# Patient Record
Sex: Male | Born: 1965 | Race: White | Hispanic: No | Marital: Single | Smoking: Never smoker
Health system: Southern US, Community
[De-identification: ages and names within clinical notes are randomized; demographics above are authoritative.]

## PROBLEM LIST (undated history)

## (undated) DIAGNOSIS — F329 Major depressive disorder, single episode, unspecified: Secondary | ICD-10-CM

## (undated) DIAGNOSIS — F32A Depression, unspecified: Secondary | ICD-10-CM

## (undated) DIAGNOSIS — R480 Dyslexia and alexia: Secondary | ICD-10-CM

## (undated) DIAGNOSIS — Z8619 Personal history of other infectious and parasitic diseases: Secondary | ICD-10-CM

## (undated) HISTORY — PX: NO PAST SURGERIES: SHX2092

## (undated) HISTORY — DX: Dyslexia and alexia: R48.0

## (undated) HISTORY — DX: Depression, unspecified: F32.A

## (undated) HISTORY — DX: Personal history of other infectious and parasitic diseases: Z86.19

## (undated) HISTORY — DX: Major depressive disorder, single episode, unspecified: F32.9

---

## 2014-10-02 DIAGNOSIS — F609 Personality disorder, unspecified: Secondary | ICD-10-CM | POA: Diagnosis not present

## 2014-10-30 DIAGNOSIS — F609 Personality disorder, unspecified: Secondary | ICD-10-CM | POA: Diagnosis not present

## 2015-01-05 DIAGNOSIS — F609 Personality disorder, unspecified: Secondary | ICD-10-CM | POA: Diagnosis not present

## 2015-06-08 DIAGNOSIS — Z23 Encounter for immunization: Secondary | ICD-10-CM | POA: Diagnosis not present

## 2015-06-08 DIAGNOSIS — Z79899 Other long term (current) drug therapy: Secondary | ICD-10-CM | POA: Diagnosis not present

## 2015-06-08 DIAGNOSIS — F609 Personality disorder, unspecified: Secondary | ICD-10-CM | POA: Diagnosis not present

## 2016-06-03 DIAGNOSIS — Z23 Encounter for immunization: Secondary | ICD-10-CM | POA: Diagnosis not present

## 2016-08-03 ENCOUNTER — Ambulatory Visit (INDEPENDENT_AMBULATORY_CARE_PROVIDER_SITE_OTHER): Payer: Medicare Other | Admitting: Family Medicine

## 2016-08-03 ENCOUNTER — Encounter: Payer: Self-pay | Admitting: Family Medicine

## 2016-08-03 VITALS — BP 120/78 | HR 99 | Temp 97.9°F | Ht 74.0 in | Wt 236.4 lb

## 2016-08-03 DIAGNOSIS — Z114 Encounter for screening for human immunodeficiency virus [HIV]: Secondary | ICD-10-CM | POA: Diagnosis not present

## 2016-08-03 DIAGNOSIS — Z23 Encounter for immunization: Secondary | ICD-10-CM | POA: Diagnosis not present

## 2016-08-03 DIAGNOSIS — Z0000 Encounter for general adult medical examination without abnormal findings: Secondary | ICD-10-CM | POA: Diagnosis not present

## 2016-08-03 DIAGNOSIS — E6609 Other obesity due to excess calories: Secondary | ICD-10-CM

## 2016-08-03 DIAGNOSIS — Z13220 Encounter for screening for lipoid disorders: Secondary | ICD-10-CM

## 2016-08-03 DIAGNOSIS — Z1211 Encounter for screening for malignant neoplasm of colon: Secondary | ICD-10-CM | POA: Diagnosis not present

## 2016-08-03 DIAGNOSIS — Z6830 Body mass index (BMI) 30.0-30.9, adult: Secondary | ICD-10-CM | POA: Diagnosis not present

## 2016-08-03 LAB — COMPREHENSIVE METABOLIC PANEL
ALBUMIN: 4.5 g/dL (ref 3.5–5.2)
ALT: 26 U/L (ref 0–53)
AST: 19 U/L (ref 0–37)
Alkaline Phosphatase: 65 U/L (ref 39–117)
BUN: 11 mg/dL (ref 6–23)
CALCIUM: 9.3 mg/dL (ref 8.4–10.5)
CHLORIDE: 105 meq/L (ref 96–112)
CO2: 28 mEq/L (ref 19–32)
Creatinine, Ser: 1.09 mg/dL (ref 0.40–1.50)
GFR: 75.94 mL/min (ref >60.00)
Glucose, Bld: 120 mg/dL — ABNORMAL HIGH (ref 70–99)
POTASSIUM: 3.3 meq/L — AB (ref 3.5–5.1)
SODIUM: 142 meq/L (ref 135–145)
Total Bilirubin: 0.8 mg/dL (ref 0.2–1.2)
Total Protein: 7.5 g/dL (ref 6.0–8.3)

## 2016-08-03 LAB — LIPID PANEL
CHOL/HDL RATIO: 7
CHOLESTEROL: 300 mg/dL — AB (ref 0–200)
HDL: 45.8 mg/dL (ref >39.00)
NonHDL: 253.93
TRIGLYCERIDES: 240 mg/dL — AB (ref 0.0–149.0)
VLDL: 48 mg/dL — AB (ref 0.0–40.0)

## 2016-08-03 LAB — LDL CHOLESTEROL, DIRECT: Direct LDL: 208 mg/dL

## 2016-08-03 LAB — HIV ANTIBODY (ROUTINE TESTING W REFLEX): HIV 1&2 Ab, 4th Generation: NONREACTIVE

## 2016-08-03 MED ORDER — POTASSIUM CHLORIDE CRYS ER 20 MEQ PO TBCR: 20.0000 meq | EXTENDED_RELEASE_TABLET | Freq: Two times a day (BID) | ORAL | 1 refills | Status: DC

## 2016-08-03 NOTE — Progress Notes (Addendum)
Chief Complaint  Patient presents with  . Establish Care    Subjective: Pt here for initial Welcome to Medicare Evaluation.  Past Medical History:  Diagnosis Date  . Depression   . Dyslexia   . History of chicken pox    Family History  Problem Relation Age of Onset  . COPD Mother   . Hypertension Mother   . Diabetes Mother   . Cancer Father     Prostate  . Diabetes Father   . Hypertension Father   . Cancer Brother     Kidney  . Hypertension Brother   . Hyperlipidemia Brother   . Diabetes Brother   . Hyperlipidemia Maternal Uncle   . Hypertension Maternal Uncle   . Diabetes Maternal Uncle   . Heart disease Maternal Uncle   . Diabetes Paternal Grandmother   . Heart disease Paternal Grandmother   . Hypertension Paternal Grandmother   . Glaucoma Paternal Grandmother    No past surgical history on file.  No Known Allergies  Females: Smoking, alcohol/drugs, sunscreen  Mental Health/Substance abuse evaluation: Yes- low risk re in doing things? No  Cognitive function: AxO x 4  Fall Risk: Less than 2 falls within the past 12 months? Yes  Mindful of grabbing bars in bathroom, ruffles in rugs, poorly lit areas, handrails on the stairs? Yes   Discussion of functional ability done: Encouraged to maintain physical activity and flexibility. Live alone? No  Need help with the following? Bathing: No  Managing money: Yes  Taking medications: No  Telephone use: No  Transportation: No  Shopping: No  Preparing meals: Yes   Hearing screen: Trouble hearing TV or radio when others do not? No  Straining or struggling to hear/understand conversation? No   Fire safety: Have a working smoke alarm? Yes   Diet Balanced diet? Yes  3 meals daily? Yes  Assistance needed? No   BP 120/78 (BP Location: Left Arm, Patient Position: Sitting, Cuff Size: Large)   Pulse 99   Temp 97.9 F (36.6 C) (Oral)   Ht 6\' 2"  (1.88 m)   Wt 236 lb 6.4 oz (107.2 kg)   SpO2 98%   BMI 30.35  kg/m   End of life care planning/counseling: Does patient wish to discuss end of life care/planning? No  Does the patient have an advanced directive? No  Patient code status/living will: Full code Forms given? No   Care Team members: None  Assessment and Plan Welcome to Medicare preventive visit  Special screening for malignant neoplasms, colon - Plan: Ambulatory referral to Gastroenterology  Screening for HIV (human immunodeficiency virus) - Plan: HIV antibody  Screening cholesterol level  Class 1 obesity due to excess calories without serious comorbidity with body mass index (BMI) of 30.0 to 30.9 in adult - Plan: Comprehensive metabolic panel, Lipid panel Colonoscopy- Written plan in AVS for preventative health services. Immunizations updated today. F/u in 1 year for AWV or prn. The patient voiced understanding and agreement to the plan.  [place], DO 08/03/16 12:15 PM

## 2016-08-03 NOTE — Patient Instructions (Signed)
Will set up colonoscopy and contact you regarding result.

## 2016-08-03 NOTE — Addendum Note (Signed)
Addended by: Spindale Cellar on: 08/03/2016 04:37 PM   Modules accepted: Orders

## 2016-08-09 ENCOUNTER — Encounter: Payer: Self-pay | Admitting: Physician Assistant

## 2016-08-10 ENCOUNTER — Other Ambulatory Visit (INDEPENDENT_AMBULATORY_CARE_PROVIDER_SITE_OTHER): Payer: Medicare Other

## 2016-08-10 DIAGNOSIS — Z13220 Encounter for screening for lipoid disorders: Secondary | ICD-10-CM | POA: Diagnosis not present

## 2016-08-10 LAB — BASIC METABOLIC PANEL
BUN: 14 mg/dL (ref 6–23)
CO2: 30 mEq/L (ref 19–32)
Calcium: 9.3 mg/dL (ref 8.4–10.5)
Chloride: 104 mEq/L (ref 96–112)
Creatinine, Ser: 1.04 mg/dL (ref 0.40–1.50)
GFR: 80.16 mL/min (ref >60.00)
Glucose, Bld: 117 mg/dL — ABNORMAL HIGH (ref 70–99)
POTASSIUM: 4.5 meq/L (ref 3.5–5.1)
SODIUM: 141 meq/L (ref 135–145)

## 2016-08-12 ENCOUNTER — Telehealth: Payer: Self-pay | Admitting: Emergency Medicine

## 2016-08-12 NOTE — Telephone Encounter (Signed)
Tried to contact pt to discuss lab results. No answer, left voicemail for pt to return call.

## 2016-08-12 NOTE — Telephone Encounter (Signed)
-----   Message from Shelda Pal, DO sent at 08/10/2016 12:51 PM EST ----- K came up nicely.  TL- let pt know his potassium is now in the normal range and he can stop the supplement. Place a future BMP in 4-6 weeks and have him come in for a recheck to make sure it is still WNL off the supplement. Nonfasting. TY.

## 2016-08-17 ENCOUNTER — Ambulatory Visit: Payer: Medicare Other | Admitting: Physician Assistant

## 2016-09-19 ENCOUNTER — Other Ambulatory Visit: Payer: Self-pay | Admitting: Family Medicine

## 2016-09-19 MED ORDER — FLUOXETINE HCL 20 MG PO CAPS: 20.0000 mg | ORAL_CAPSULE | Freq: Every day | ORAL | 1 refills | Status: DC

## 2016-09-19 NOTE — Telephone Encounter (Addendum)
Relation to [id number] Call back number:[phone] Pharmacy: [facility], [place] [phone] (Phone) [phone] (Fax)      Reason for call:  Patient requesting a refill FLUoxetine (PROZAC) 20 MG capsule

## 2016-09-19 NOTE — Telephone Encounter (Signed)
Notified stepmom Rise Paganini per [facility] consent.

## 2016-10-27 ENCOUNTER — Encounter: Payer: Self-pay | Admitting: Family Medicine

## 2016-10-27 ENCOUNTER — Ambulatory Visit (INDEPENDENT_AMBULATORY_CARE_PROVIDER_SITE_OTHER): Payer: Medicare HMO | Admitting: Family Medicine

## 2016-10-27 VITALS — BP 138/87 | HR 99 | Temp 97.9°F | Ht 74.0 in | Wt 236.8 lb

## 2016-10-27 DIAGNOSIS — Z0000 Encounter for general adult medical examination without abnormal findings: Secondary | ICD-10-CM | POA: Diagnosis not present

## 2016-10-27 DIAGNOSIS — E7800 Pure hypercholesterolemia, unspecified: Secondary | ICD-10-CM | POA: Diagnosis not present

## 2016-10-27 DIAGNOSIS — E785 Hyperlipidemia, unspecified: Secondary | ICD-10-CM | POA: Insufficient documentation

## 2016-10-27 DIAGNOSIS — R454 Irritability and anger: Secondary | ICD-10-CM | POA: Diagnosis not present

## 2016-10-27 DIAGNOSIS — Z1211 Encounter for screening for malignant neoplasm of colon: Secondary | ICD-10-CM | POA: Diagnosis not present

## 2016-10-27 DIAGNOSIS — R69 Illness, unspecified: Secondary | ICD-10-CM | POA: Diagnosis not present

## 2016-10-27 NOTE — Patient Instructions (Addendum)
[facility] [address] Marily Memos [place], [place] [address] [phone]  [facility] [address] [place], [place] [address] [phone]  [facility] [place], [place] [address] [phone]  Dr. Sheralyn Boatman [address] [place], [place] [address] [phone]  Call one of these offices to set up with a psychiatrist if that is what you need. It is a self-referral process.   Healthy Eating Plan Many factors influence your heart health, including eating and exercise habits. Heart (coronary) risk increases with abnormal blood fat (lipid) levels. Heart-healthy meal planning includes limiting unhealthy fats, increasing healthy fats, and making other small dietary changes. This includes maintaining a healthy body weight to help keep lipid levels within a normal range.  WHAT IS MY PLAN?  Your health care provider recommends that you:  Drink a glass of water before meals to help with satiety.  Eat slowly.  An alternative to the water is to add Metamucil. This will help with satiety as well. It does contain calories, unlike water.  WHAT TYPES OF FAT SHOULD I CHOOSE?  Choose healthy fats more often. Choose monounsaturated and polyunsaturated fats, such as olive oil and canola oil, flaxseeds, walnuts, almonds, and seeds.  Eat more omega-3 fats. Good choices include salmon, mackerel, sardines, tuna, flaxseed oil, and ground flaxseeds. Aim to eat fish at least two times each week.  Avoid foods with partially hydrogenated oils in them. These contain trans fats. Examples of foods that contain trans fats are stick margarine, some tub margarines, cookies, crackers, and other baked goods. If you are going to avoid a fat, this is the one to avoid!  WHAT GENERAL GUIDELINES DO I NEED TO FOLLOW?  Check food labels carefully to identify foods with trans fats. Avoid these types of options when possible.  Fill one  half of your plate with vegetables and green salads. Eat 4-5 servings of vegetables per day. A serving of vegetables equals 1 cup of raw leafy vegetables,  cup of raw or cooked cut-up vegetables, or  cup of vegetable juice.  Fill one fourth of your plate with whole grains. Look for the word "whole" as the first word in the ingredient list.  Fill one fourth of your plate with lean protein foods.  Eat 4-5 servings of fruit per day. A serving of fruit equals one medium whole fruit,  cup of dried fruit,  cup of fresh, frozen, or canned fruit. Try to avoid fruits in cups/syrups as the sugar content can be high.  Eat more foods that contain soluble fiber. Examples of foods that contain this type of fiber are apples, broccoli, carrots, beans, peas, and barley. Aim to get 20-30 g of fiber per day.  Eat more home-cooked food and less restaurant, buffet, and fast food.  Limit or avoid alcohol.  Limit foods that are high in starch and sugar.  Avoid fried foods when able.  Cook foods by using methods other than frying. Baking, boiling, grilling, and broiling are all great options. Other fat-reducing suggestions include: ? Removing the skin from poultry. ? Removing all visible fats from meats. ? Skimming the fat off of stews, soups, and gravies before serving them. ? Steaming vegetables in water or broth.  Lose weight if you are overweight. Losing just 5-10% of your initial body weight can help your overall health and prevent diseases such as diabetes and heart disease.  Increase your consumption of nuts, legumes, and seeds to 4-5 servings per  week. One serving of dried beans or legumes equals  cup after being cooked, one serving of nuts equals 1 ounces, and one serving of seeds equals  ounce or 1 tablespoon.  WHAT ARE GOOD FOODS CAN I EAT? Grains Grainy breads (try to find bread that is 3 g of fiber per slice or greater), oatmeal, light popcorn. Whole-grain cereals. Rice and pasta, including  brown rice and those that are made with whole wheat. Edamame pasta is a great alternative to grain pasta. It has a higher protein content. Try to avoid significant consumption of white bread, sugary cereals, or pastries/baked goods.  Vegetables All vegetables. Cooked white potatoes do not count as vegetables.  Fruits All fruits, but limit pineapple and bananas as these fruits have a higher sugar content.  Meats and Other Protein Sources Lean, well-trimmed beef, veal, pork, and lamb. Chicken and [place] without skin. All fish and shellfish. Wild duck, rabbit, pheasant, and venison. Egg whites or low-cholesterol egg substitutes. Dried beans, peas, lentils, and tofu.Seeds and most nuts.  Dairy Low-fat or nonfat cheeses, including ricotta, string, and mozzarella. Skim or 1% milk that is liquid, powdered, or evaporated. Buttermilk that is made with low-fat milk. Nonfat or low-fat yogurt. Soy/Almond milk are good alternatives if you cannot handle dairy.  Beverages Water is the best for you. Sports drinks with less sugar are more desirable unless you are a highly active athlete.  Sweets and Desserts Sherbets and fruit ices. Honey, jam, marmalade, jelly, and syrups. Dark chocolate.  Eat all sweets and desserts in moderation.  Fats and Oils Nonhydrogenated (trans-free) margarines. Vegetable oils, including soybean, sesame, sunflower, olive, peanut, safflower, corn, canola, and cottonseed. Salad dressings or mayonnaise that are made with a vegetable oil. Limit added fats and oils that you use for cooking, baking, salads, and as spreads.  Other Cocoa powder. Coffee and tea. Most condiments.  The items listed above may not be a complete list of recommended foods or beverages. Contact your dietitian for more options.

## 2016-10-27 NOTE — Progress Notes (Signed)
Chief Complaint  Patient presents with  . Annual Exam    Well Male Ross Haynes is here for a complete physical.   His last physical was >1 year ago.  Current diet: in general, an "unhealthy" diet   Current exercise: works out at the [employer] trend: stable Does pt snore? Yes. Daytime fatigue? No. Seat belt? Yes.    Health Maintenance Flu- 2017 Td- 07/2016 Colonoscopy- wishes to see [facility] PNA- NA  Past Medical History:  Diagnosis Date  . Depression   . Dyslexia   . History of chicken pox     No past surgical history on file. Medications  Current Outpatient Prescriptions on File Prior to Visit  Medication Sig Dispense Refill  . FLUoxetine (PROZAC) 20 MG capsule Take 1 capsule (20 mg total) by mouth daily. 90 capsule 1  . OVER THE COUNTER MEDICATION Vitamin B12 supplement gummies-Chew 2 gummies by mouth daily.     Allergies No Known Allergies Family History Family History  Problem Relation Age of Onset  . COPD Mother   . Hypertension Mother   . Diabetes Mother   . Cancer Father     Prostate  . Diabetes Father   . Hypertension Father   . Cancer Brother     Kidney  . Hypertension Brother   . Hyperlipidemia Brother   . Diabetes Brother   . Hyperlipidemia Maternal Uncle   . Hypertension Maternal Uncle   . Diabetes Maternal Uncle   . Heart disease Maternal Uncle   . Diabetes Paternal Grandmother   . Heart disease Paternal Grandmother   . Hypertension Paternal Grandmother   . Glaucoma Paternal Grandmother     Review of Systems: Constitutional:  no unexpected change in weight, no fevers or chills Eye:  no recent significant change in vision Ear/Nose/Mouth/Throat:  Ears:  no tinnitus or hearing loss Nose/Mouth/Throat:  no complaints of nasal congestion or bleeding, no sore throat and oral sores Cardiovascular:  no chest pain, no palpitations Respiratory:  no cough and no shortness of breath Gastrointestinal:  no abdominal pain, no change in bowel  habits, no nausea, vomiting, diarrhea, or constipation and no black or bloody stool GU:  Male: negative for dysuria, frequency, and incontinence and negative for prostate symptoms Musculoskeletal/Extremities:  no pain, redness, or swelling of the joints Integumentary (Skin/Breast):  no abnormal skin lesions reported Neurologic:  no headaches, no numbness, tingling Endocrine:  weight changes, masses in the neck, heat/cold intolerance, bowel or skin changes, or cardiovascular system symptoms Hematologic/Lymphatic:  no abnormal bleeding, no HIV risk factors, no night sweats, no swollen nodes, no weight loss  Exam BP 138/87 (BP Location: Left Arm, Patient Position: Sitting, Cuff Size: Large)   Pulse 99   Temp 97.9 F (36.6 C) (Oral)   Ht 6\' 2"  (1.88 m)   Wt 236 lb 12.8 oz (107.4 kg)   SpO2 100% Comment: RA  BMI 30.40 kg/m  General:  well developed, well nourished, in no apparent distress Skin:  no significant moles, warts, or growths Head:  no masses, lesions, or tenderness Eyes:  pupils equal and round, sclera anicteric without injection Ears:  canals without lesions, TMs shiny without retraction, no obvious effusion, no erythema Nose:  nares patent, septum midline, mucosa normal Throat/Pharynx:  lips and gingiva without lesion; tongue and uvula midline; non-inflamed pharynx; no exudates or postnasal drainage Neck: neck supple without adenopathy, thyromegaly, or masses Lungs:  clear to auscultation, breath sounds equal bilaterally, no respiratory distress Cardio:  regular  rate and rhythm without murmurs, heart sounds without clicks or rubs Abdomen:  abdomen soft, nontender; bowel sounds normal; no masses or organomegaly Genital (male): circumcised penis, no lesions or discharge; testes present bilaterally without masses or tenderness Rectal: Deferred Musculoskeletal:  symmetrical muscle groups noted without atrophy or deformity Extremities:  no clubbing, cyanosis, or edema, no  deformities, no skin discoloration Neuro:  gait normal; deep tendon reflexes normal and symmetric Psych: well oriented with normal range of affect and appropriate judgment/insight  Assessment and Plan  Well adult exam  Difficulty controlling anger - Plan: Ambulatory referral to Psychology  Screen for colon cancer - Plan: Ambulatory referral to Gastroenterology   Well 51 y.o. male. Counseled on diet and exercise. Diet handout given.  Follow up in 2 mo to assess weight loss, dietary changes and cholesterol- if still elevated will start statin. Provided psych resources for self referral if that is what he needs. Discussed prostate cancer screen, will opt to wait until age 89 before biannual PSA testing. The patient and his guardian voiced understanding and agreement to the plan.  [place], DO 10/27/16 10:05 AM

## 2016-10-27 NOTE — Progress Notes (Signed)
Pre visit review using our clinic review tool, if applicable. No additional management support is needed unless otherwise documented below in the visit note. 

## 2017-01-04 ENCOUNTER — Encounter: Payer: Self-pay | Admitting: Family Medicine

## 2017-01-04 ENCOUNTER — Ambulatory Visit (INDEPENDENT_AMBULATORY_CARE_PROVIDER_SITE_OTHER): Payer: Medicare HMO | Admitting: Family Medicine

## 2017-01-04 ENCOUNTER — Ambulatory Visit (HOSPITAL_BASED_OUTPATIENT_CLINIC_OR_DEPARTMENT_OTHER)
Admission: RE | Admit: 2017-01-04 | Discharge: 2017-01-04 | Disposition: A | Discharge status: Home / Self Care | Payer: Medicare HMO | Source: Ambulatory Visit | Attending: Family Medicine | Admitting: Family Medicine

## 2017-01-04 VITALS — BP 133/86 | HR 84 | Temp 97.6°F | Ht 74.0 in | Wt 237.8 lb

## 2017-01-04 DIAGNOSIS — S62660S Nondisplaced fracture of distal phalanx of right index finger, sequela: Secondary | ICD-10-CM | POA: Insufficient documentation

## 2017-01-04 DIAGNOSIS — X58XXXS Exposure to other specified factors, sequela: Secondary | ICD-10-CM | POA: Insufficient documentation

## 2017-01-04 DIAGNOSIS — S6991XS Unspecified injury of right wrist, hand and finger(s), sequela: Secondary | ICD-10-CM

## 2017-01-04 DIAGNOSIS — S62660A Nondisplaced fracture of distal phalanx of right index finger, initial encounter for closed fracture: Secondary | ICD-10-CM | POA: Diagnosis not present

## 2017-01-04 NOTE — Patient Instructions (Signed)
Ice/cold pack over area for 10-15 min every 2-3 hours while awake.  OK to take Tylenol 1000 mg (2 extra strength tabs) or 975 mg (3 regular strength tabs) every 6 hours as needed.  Ibuprofen 400-600 mg (2-3 over the counter strength tabs) every 6 hours as needed for pain.  Wear the splint in public or if you are going to use your fingers/hand.

## 2017-01-04 NOTE — Progress Notes (Signed)
Pre visit review using our clinic review tool, if applicable. No additional management support is needed unless otherwise documented below in the visit note. 

## 2017-01-04 NOTE — Progress Notes (Signed)
Musculoskeletal Exam  Patient: Ross Haynes DOB: 10-Apr-1966  DOS: 01/04/2017  SUBJECTIVE:  Chief Complaint:   Chief Complaint  Patient presents with  . Finger Injury    4//22/18-(R) index-playing basketball    Ross Haynes is a 51 y.o.  male for evaluation and treatment of R index finger pain.   Onset:  15 week ago. Jammed in while playing basketball- the ball hit him square in the tip of the finger.  Location: R DIP Character:  aching  Progression of issue:  is unchanged Associated symptoms: Swelling, some redness Treatment: to date has been ice.   Neurovascular symptoms: no He has never broken a bone before.  ROS: Musculoskeletal/Extremities: +R index finger pain Neurologic: no numbness, tingling no weakness   Past Medical History:  Diagnosis Date  . Depression   . Dyslexia   . History of chicken pox    No past surgical history on file. Family History  Problem Relation Age of Onset  . COPD Mother   . Hypertension Mother   . Diabetes Mother   . Cancer Father     Prostate  . Diabetes Father   . Hypertension Father   . Cancer Brother     Kidney  . Hypertension Brother   . Hyperlipidemia Brother   . Diabetes Brother   . Hyperlipidemia Maternal Uncle   . Hypertension Maternal Uncle   . Diabetes Maternal Uncle   . Heart disease Maternal Uncle   . Diabetes Paternal Grandmother   . Heart disease Paternal Grandmother   . Hypertension Paternal Grandmother   . Glaucoma Paternal Grandmother    Current Outpatient Prescriptions  Medication Sig Dispense Refill  . FLUoxetine (PROZAC) 20 MG capsule Take 1 capsule (20 mg total) by mouth daily. 90 capsule 1  . OVER THE COUNTER MEDICATION Vitamin B12 supplement gummies-Chew 2 gummies by mouth daily.     No Known Allergies Social History   Social History  . Marital status: Single   Social History Main Topics  . Smoking status: Never Smoker  . Smokeless tobacco: Never Used  . Alcohol use No  . Drug  use: No   Objective: VITAL SIGNS: BP 133/86 (BP Location: Left Arm, Patient Position: Sitting, Cuff Size: Large)   Pulse 84   Temp 97.6 F (36.4 C) (Oral)   Ht 6\' 2"  (1.88 m)   Wt 237 lb 12.8 oz (107.9 kg)   SpO2 96%   BMI 30.53 kg/m  Constitutional: Well formed, well developed. No acute distress. Cardiovascular: Brisk cap refill Thorax & Lungs: No accessory muscle use Extremities: No clubbing. No cyanosis.  Skin: Warm. Dry.  Musculoskeletal: R index finger.   Normal active range of motion: no.   Normal passive range of motion: no Tenderness to palpation: no Edematous over DIP distal phalanx Ecchymosis: no Neurologic: Normal sensory function.  Psychiatric: Normal mood.  Alert & oriented x 3.    Assessment:  Closed nondisplaced fracture of distal phalanx of right index finger, initial encounter - Plan: Ambulatory referral to Orthopedic Surgery  Injury of finger of right hand, sequela - Plan: DG Finger Index Right  Plan: Orders as above. Frog splint given. Ibuprofen, Tylenol, ice. F/u prn. The patient voiced understanding and agreement to the plan.   [place], DO 01/04/17  9:54 AM

## 2017-01-06 DIAGNOSIS — S62660A Nondisplaced fracture of distal phalanx of right index finger, initial encounter for closed fracture: Secondary | ICD-10-CM | POA: Diagnosis not present

## 2017-01-06 DIAGNOSIS — M79644 Pain in right finger(s): Secondary | ICD-10-CM | POA: Diagnosis not present

## 2017-01-24 DIAGNOSIS — Z1211 Encounter for screening for malignant neoplasm of colon: Secondary | ICD-10-CM | POA: Diagnosis not present

## 2017-01-24 DIAGNOSIS — Z8371 Family history of colonic polyps: Secondary | ICD-10-CM | POA: Diagnosis not present

## 2017-01-24 DIAGNOSIS — D122 Benign neoplasm of ascending colon: Secondary | ICD-10-CM | POA: Diagnosis not present

## 2017-01-26 ENCOUNTER — Ambulatory Visit (INDEPENDENT_AMBULATORY_CARE_PROVIDER_SITE_OTHER): Payer: Medicare HMO | Admitting: Psychology

## 2017-01-26 DIAGNOSIS — F429 Obsessive-compulsive disorder, unspecified: Secondary | ICD-10-CM

## 2017-01-26 DIAGNOSIS — R69 Illness, unspecified: Secondary | ICD-10-CM | POA: Diagnosis not present

## 2017-01-26 DIAGNOSIS — F70 Mild intellectual disabilities: Secondary | ICD-10-CM | POA: Diagnosis not present

## 2017-02-02 DIAGNOSIS — S62660D Nondisplaced fracture of distal phalanx of right index finger, subsequent encounter for fracture with routine healing: Secondary | ICD-10-CM | POA: Diagnosis not present

## 2017-03-06 DIAGNOSIS — S62660D Nondisplaced fracture of distal phalanx of right index finger, subsequent encounter for fracture with routine healing: Secondary | ICD-10-CM | POA: Diagnosis not present

## 2017-03-14 ENCOUNTER — Telehealth: Payer: Self-pay | Admitting: Family Medicine

## 2017-03-14 NOTE — Telephone Encounter (Signed)
Caller name: Ross Haynes Relationship to patient: self Can be reached: [phone] Pharmacy: [facility], [place]  Reason for call: Pt called for refill on FLUoxetine stating he has enough til Sunday. Takes 1 per morning. Please send refills.

## 2017-03-15 ENCOUNTER — Other Ambulatory Visit: Payer: Self-pay | Admitting: Family Medicine

## 2017-03-15 MED ORDER — FLUOXETINE HCL 20 MG PO CAPS: 20.0000 mg | ORAL_CAPSULE | Freq: Every day | ORAL | 1 refills | Status: DC

## 2017-03-15 NOTE — Telephone Encounter (Signed)
Called and spoke with the pt and informed him that his refill has been approved and sent to the pharmacy.  Also informed the pt that Dr. Nani Ravens would like for him to schedule a follow-up appt..  Pt verbalized understanding and agreed.  Pt was scheduled for (Mon-07/03/17 @ 11:00am).//AB/CMA

## 2017-03-15 NOTE — Telephone Encounter (Signed)
Calling and checking on status of refill

## 2017-03-20 DIAGNOSIS — E669 Obesity, unspecified: Secondary | ICD-10-CM | POA: Diagnosis not present

## 2017-03-20 DIAGNOSIS — Z6833 Body mass index (BMI) 33.0-33.9, adult: Secondary | ICD-10-CM | POA: Diagnosis not present

## 2017-03-20 DIAGNOSIS — Z972 Presence of dental prosthetic device (complete) (partial): Secondary | ICD-10-CM | POA: Diagnosis not present

## 2017-03-20 DIAGNOSIS — Z0000 Encounter for general adult medical examination without abnormal findings: Secondary | ICD-10-CM | POA: Diagnosis not present

## 2017-03-20 DIAGNOSIS — R69 Illness, unspecified: Secondary | ICD-10-CM | POA: Diagnosis not present

## 2017-06-16 DIAGNOSIS — R69 Illness, unspecified: Secondary | ICD-10-CM | POA: Diagnosis not present

## 2017-07-03 ENCOUNTER — Encounter: Payer: Self-pay | Admitting: Family Medicine

## 2017-07-03 ENCOUNTER — Ambulatory Visit: Payer: Medicare HMO | Admitting: Family Medicine

## 2017-07-03 VITALS — BP 114/72 | HR 103 | Temp 97.9°F | Ht 74.0 in | Wt 248.2 lb

## 2017-07-03 DIAGNOSIS — Z23 Encounter for immunization: Secondary | ICD-10-CM | POA: Diagnosis not present

## 2017-07-03 DIAGNOSIS — F32A Depression, unspecified: Secondary | ICD-10-CM

## 2017-07-03 DIAGNOSIS — F329 Major depressive disorder, single episode, unspecified: Secondary | ICD-10-CM | POA: Diagnosis not present

## 2017-07-03 DIAGNOSIS — R69 Illness, unspecified: Secondary | ICD-10-CM | POA: Diagnosis not present

## 2017-07-03 DIAGNOSIS — R454 Irritability and anger: Secondary | ICD-10-CM | POA: Diagnosis not present

## 2017-07-03 NOTE — Patient Instructions (Addendum)
Stay well hydrated over the next couple days. You might not feel the greatest over the next 2 days due to the vaccine.   Please consider counseling. The medical literature and evidence-based guidelines support it. Contact [phone] to schedule an appointment or inquire about cost/insurance coverage.  Let us know if you need anything.

## 2017-07-03 NOTE — Progress Notes (Signed)
Pre visit review using our clinic review tool, if applicable. No additional management support is needed unless otherwise documented below in the visit note. 

## 2017-07-03 NOTE — Progress Notes (Signed)
Chief Complaint  Patient presents with  . Follow-up    Subjective Ross Haynes presents for f/u agitation.  He is currently being treated with Prozac.  Reports good control since treatemnt. Reports somewhat healthy diet overall. Reports getting routine exercise. No thoughts of harming self or others. No self-medication with alcohol, prescription drugs or illicit drugs. He is not following with a counselor/psychologist.  ROS Psych: No homicidal or suicidal thoughts  Past Medical History:  Diagnosis Date  . Depression   . Dyslexia   . History of chicken pox     Exam BP 114/72 (BP Location: Left Arm, Patient Position: Sitting, Cuff Size: Large)   Pulse (!) 103   Temp 97.9 F (36.6 C) (Oral)   Ht 6\' 2"  (1.88 m)   Wt 248 lb 4 oz (112.6 kg)   SpO2 95%   BMI 31.87 kg/m  General:  well developed, well nourished, in no apparent distress Lungs:  no respiratory distress Psych: well oriented with normal range of affect, alert and oriented x4.  Assessment and Plan  Difficulty controlling anger  Depression, unspecified depression type  Cont Prozac. # for counseling given. Doing well otherwise. F/u in 6 mo for CPE. The patient voiced understanding and agreement to the plan.  [place], DO 07/03/17 11:21 AM

## 2017-07-03 NOTE — Addendum Note (Signed)
Addended by: Sharon Seller B on: 07/03/2017 11:33 AM   Modules accepted: Orders

## 2017-07-07 DIAGNOSIS — H4311 Vitreous hemorrhage, right eye: Secondary | ICD-10-CM | POA: Diagnosis not present

## 2017-07-07 DIAGNOSIS — H43811 Vitreous degeneration, right eye: Secondary | ICD-10-CM | POA: Diagnosis not present

## 2017-07-07 DIAGNOSIS — H33031 Retinal detachment with giant retinal tear, right eye: Secondary | ICD-10-CM | POA: Diagnosis not present

## 2017-07-11 DIAGNOSIS — H33031 Retinal detachment with giant retinal tear, right eye: Secondary | ICD-10-CM | POA: Diagnosis not present

## 2017-07-11 DIAGNOSIS — R69 Illness, unspecified: Secondary | ICD-10-CM | POA: Diagnosis not present

## 2017-07-11 DIAGNOSIS — H4311 Vitreous hemorrhage, right eye: Secondary | ICD-10-CM | POA: Diagnosis not present

## 2017-09-11 ENCOUNTER — Telehealth: Payer: Self-pay | Admitting: Family Medicine

## 2017-09-11 ENCOUNTER — Other Ambulatory Visit: Payer: Self-pay

## 2017-09-11 MED ORDER — FLUOXETINE HCL 20 MG PO CAPS: 20.0000 mg | ORAL_CAPSULE | Freq: Every day | ORAL | 1 refills | Status: DC

## 2017-09-11 NOTE — Telephone Encounter (Signed)
Copied from [place] [id number]. Topic: Quick Communication - Rx Refill/Question >> Sep 11, 2017 11:57 AM Marin Olp L wrote: Medication: FLUoxetine (PROZAC) 20 MG capsule    Has the patient contacted their pharmacy? No. (no refills)   (Agent: If no, request that the patient contact the pharmacy for the refill.)   Preferred Pharmacy (with phone number or street name): [facility], [place]: Please be advised that RX refills may take up to 3 business days. We ask that you follow-up with your pharmacy.

## 2017-11-10 ENCOUNTER — Telehealth: Payer: Self-pay | Admitting: Family Medicine

## 2017-11-10 NOTE — Telephone Encounter (Signed)
Attempted to call patient to schedule Annual Wellness Visit, but patient did not answer. Will try to call patient again at a later time. SF °

## 2017-11-23 ENCOUNTER — Encounter: Payer: Self-pay | Admitting: Family Medicine

## 2017-11-23 ENCOUNTER — Ambulatory Visit (INDEPENDENT_AMBULATORY_CARE_PROVIDER_SITE_OTHER): Payer: Medicare HMO | Admitting: Family Medicine

## 2017-11-23 VITALS — BP 120/80 | HR 118 | Temp 98.0°F | Ht 74.0 in | Wt 249.5 lb

## 2017-11-23 DIAGNOSIS — L237 Allergic contact dermatitis due to plants, except food: Secondary | ICD-10-CM | POA: Diagnosis not present

## 2017-11-23 MED ORDER — PREDNISONE 20 MG PO TABS: ORAL_TABLET | ORAL | 0 refills | Status: DC

## 2017-11-23 NOTE — Progress Notes (Signed)
Pre visit review using our clinic review tool, if applicable. No additional management support is needed unless otherwise documented below in the visit note. 

## 2017-11-23 NOTE — Patient Instructions (Addendum)
Healthy Eating Plan Many factors influence your heart health, including eating and exercise habits. Heart (coronary) risk increases with abnormal blood fat (lipid) levels. Heart-healthy meal planning includes limiting unhealthy fats, increasing healthy fats, and making other small dietary changes. This includes maintaining a healthy body weight to help keep lipid levels within a normal range.  WHAT IS MY PLAN?  Your health care provider recommends that you:  Drink a glass of water before meals to help with satiety.  Eat slowly.  An alternative to the water is to add Metamucil. This will help with satiety as well. It does contain calories, unlike water.  WHAT TYPES OF FAT SHOULD I CHOOSE?  Choose healthy fats more often. Choose monounsaturated and polyunsaturated fats, such as olive oil and canola oil, flaxseeds, walnuts, almonds, and seeds.  Eat more omega-3 fats. Good choices include salmon, mackerel, sardines, tuna, flaxseed oil, and ground flaxseeds. Aim to eat fish at least two times each week.  Avoid foods with partially hydrogenated oils in them. These contain trans fats. Examples of foods that contain trans fats are stick margarine, some tub margarines, cookies, crackers, and other baked goods. If you are going to avoid a fat, this is the one to avoid!  WHAT GENERAL GUIDELINES DO I NEED TO FOLLOW?  Check food labels carefully to identify foods with trans fats. Avoid these types of options when possible.  Fill one half of your plate with vegetables and green salads. Eat 4-5 servings of vegetables per day. A serving of vegetables equals 1 cup of raw leafy vegetables,  cup of raw or cooked cut-up vegetables, or  cup of vegetable juice.  Fill one fourth of your plate with whole grains. Look for the word "whole" as the first word in the ingredient list.  Fill one fourth of your plate with lean protein foods.  Eat 4-5 servings of fruit per day. A serving of fruit equals one medium  whole fruit,  cup of dried fruit,  cup of fresh, frozen, or canned fruit. Try to avoid fruits in cups/syrups as the sugar content can be high.  Eat more foods that contain soluble fiber. Examples of foods that contain this type of fiber are apples, broccoli, carrots, beans, peas, and barley. Aim to get 20-30 g of fiber per day.  Eat more home-cooked food and less restaurant, buffet, and fast food.  Limit or avoid alcohol.  Limit foods that are high in starch and sugar.  Avoid fried foods when able.  Cook foods by using methods other than frying. Baking, boiling, grilling, and broiling are all great options. Other fat-reducing suggestions include: ? Removing the skin from poultry. ? Removing all visible fats from meats. ? Skimming the fat off of stews, soups, and gravies before serving them. ? Steaming vegetables in water or broth.  Lose weight if you are overweight. Losing just 5-10% of your initial body weight can help your overall health and prevent diseases such as diabetes and heart disease.  Increase your consumption of nuts, legumes, and seeds to 4-5 servings per week. One serving of dried beans or legumes equals  cup after being cooked, one serving of nuts equals 1 ounces, and one serving of seeds equals  ounce or 1 tablespoon.  WHAT ARE GOOD FOODS CAN I EAT? Grains Grainy breads (try to find bread that is 3 g of fiber per slice or greater), oatmeal, light popcorn. Whole-grain cereals. Rice and pasta, including brown rice and those that are made with whole wheat.  Edamame pasta is a great alternative to grain pasta. It has a higher protein content. Try to avoid significant consumption of white bread, sugary cereals, or pastries/baked goods.  Vegetables All vegetables. Cooked white potatoes do not count as vegetables.  Fruits All fruits, but limit pineapple and bananas as these fruits have a higher sugar content.  Meats and Other Protein Sources Lean, well-trimmed beef,  veal, pork, and lamb. Chicken and [place] without skin. All fish and shellfish. Wild duck, rabbit, pheasant, and venison. Egg whites or low-cholesterol egg substitutes. Dried beans, peas, lentils, and tofu.Seeds and most nuts.  Dairy Low-fat or nonfat cheeses, including ricotta, string, and mozzarella. Skim or 1% milk that is liquid, powdered, or evaporated. Buttermilk that is made with low-fat milk. Nonfat or low-fat yogurt. Soy/Almond milk are good alternatives if you cannot handle dairy.  Beverages Water is the best for you. Sports drinks with less sugar are more desirable unless you are a highly active athlete.  Sweets and Desserts Sherbets and fruit ices. Honey, jam, marmalade, jelly, and syrups. Dark chocolate.  Eat all sweets and desserts in moderation.  Fats and Oils Nonhydrogenated (trans-free) margarines. Vegetable oils, including soybean, sesame, sunflower, olive, peanut, safflower, corn, canola, and cottonseed. Salad dressings or mayonnaise that are made with a vegetable oil. Limit added fats and oils that you use for cooking, baking, salads, and as spreads.  Other Cocoa powder. Coffee and tea. Most condiments.  The items listed above may not be a complete list of recommended foods or beverages. Contact your dietitian for more options.  Aim to do some physical exertion for 150 minutes per week. This is typically divided into 5 days per week, 30 minutes per day. The activity should be enough to get your heart rate up. Anything is better than nothing if you have time constraints.  Let us know if you need anything.

## 2017-11-23 NOTE — Progress Notes (Signed)
Chief Complaint  Patient presents with  . Poison Roca is a 52 y.o. male here for a skin complaint.  Duration: 4 days Location: arms, hands, around eyes, behind knees Pruritic? Yes Painful? No Drainage? No New soaps/lotions/topicals/detergents? No Sick contacts? No  Has poison oak in back yard, was exposed while doing yardwork.  Other associated symptoms: some scaling Therapies tried thus far: Calamine, otc poison ivy med, rubbing alcohol.   ROS:  Const: No fevers Skin: As noted in HPI  Past Medical History:  Diagnosis Date  . Depression   . Dyslexia   . History of chicken pox    No Known Allergies Allergies as of 11/23/2017   No Known Allergies     Medication List        Accurate as of 11/23/17  7:28 AM. Always use your most recent med list.          FLUoxetine 20 MG capsule Commonly known as:  PROZAC Take 1 capsule (20 mg total) by mouth daily.   OVER THE COUNTER MEDICATION Vitamin B12 supplement gummies-Chew 2 gummies by mouth daily.   predniSONE 20 MG tablet Commonly known as:  DELTASONE 3 tabs daily for 1 week then 2 tabs daily for 1 week and then 1 tab daily.       BP 120/80 (BP Location: Left Arm, Patient Position: Sitting, Cuff Size: Large)   Pulse (!) 118   Temp 98 F (36.7 C) (Oral)   Ht 6\' 2"  (1.88 m)   Wt 249 lb 8 oz (113.2 kg)   SpO2 94%   BMI 32.03 kg/m  Gen: awake, alert, appearing stated age Lungs: No accessory muscle use Skin: See below. No drainage, TTP, fluctuance, excessive warmth Psych: Age appropriate judgment and insight     RUE    L hand    LUE  Poison oak - Plan: predniSONE (DELTASONE) 20 MG tablet  Orders as above. 3 weeks taper, 60 mg pred for 1 week, 40 mg for 1 week then 20 mg for 1 week. Try not to scratch. Also gave healthy diet handout to help lose weight. F/u prn. The patient voiced understanding and agreement to the plan.  [place], DO 11/23/17 7:28 AM

## 2017-11-30 DIAGNOSIS — H35371 Puckering of macula, right eye: Secondary | ICD-10-CM | POA: Diagnosis not present

## 2017-11-30 DIAGNOSIS — H59811 Chorioretinal scars after surgery for detachment, right eye: Secondary | ICD-10-CM | POA: Diagnosis not present

## 2017-12-21 ENCOUNTER — Ambulatory Visit (INDEPENDENT_AMBULATORY_CARE_PROVIDER_SITE_OTHER): Payer: Medicare HMO | Admitting: Family Medicine

## 2017-12-21 ENCOUNTER — Encounter: Payer: Self-pay | Admitting: Family Medicine

## 2017-12-21 VITALS — BP 118/80 | HR 112 | Temp 98.6°F | Ht 74.0 in | Wt 249.0 lb

## 2017-12-21 DIAGNOSIS — J301 Allergic rhinitis due to pollen: Secondary | ICD-10-CM

## 2017-12-21 MED ORDER — LEVOCETIRIZINE DIHYDROCHLORIDE 5 MG PO TABS: 5.0000 mg | ORAL_TABLET | Freq: Every evening | ORAL | 1 refills | Status: DC

## 2017-12-21 NOTE — Progress Notes (Signed)
Pre visit review using our clinic review tool, if applicable. No additional management support is needed unless otherwise documented below in the visit note. 

## 2017-12-21 NOTE — Patient Instructions (Addendum)
Claritin (loratadine), Allegra (fexofenadine), Zyrtec (cetirizine); these are listed in order from weakest to strongest. Generic, and therefore cheaper, options are in the parentheses.   Flonase (fluticasone); nasal spray that is over the counter. 2 sprays each nostril, once daily. Aim towards the same side eye when you spray.  There are available OTC, and the generic versions, which may be cheaper, are in parentheses. Show this to a pharmacist if you have trouble finding any of these items.  If you start having fevers, shaking or shortness of breath, seek immediate care.

## 2017-12-21 NOTE — Progress Notes (Signed)
Chief Complaint  Patient presents with  . Nasal Congestion  . Cough    Calla Kicks here for URI complaints.  Duration: 4 days  Associated symptoms: sinus congestion, rhinorrhea, itchy watery eyes and cough Denies: sinus pain, ear pain, ear drainage, sore throat, wheezing, shortness of breath, myalgia and fevers Treatment to date: Allergy medicine  Sick contacts: No  ROS:  Const: Denies fevers HEENT: As noted in HPI Lungs: No SOB  Past Medical History:  Diagnosis Date  . Depression   . Dyslexia   . History of chicken pox      BP 118/80 (BP Location: Left Arm, Patient Position: Sitting, Cuff Size: Large)   Pulse (!) 112   Temp 98.6 F (37 C) (Oral)   Ht 6\' 2"  (1.88 m)   Wt 249 lb (112.9 kg)   SpO2 94%   BMI 31.97 kg/m  General: Awake, alert, appears stated age HEENT: AT, , ears patent b/l and TM's neg, nares patent w/o discharge, pharynx pink and without exudates, MMM Neck: No masses or asymmetry Heart: RRR Lungs: CTAB, no accessory muscle use Psych: Age appropriate judgment and insight, normal mood and affect  Seasonal allergic rhinitis due to pollen - Plan: levocetirizine (XYZAL) 5 MG tablet  Orders as above. Reports some improvement, likely not bacterial.  F/u prn. If starting to experience fevers, shaking, or shortness of breath, seek immediate care. Pt voiced understanding and agreement to the plan.  [place], DO 12/21/17 11:52 AM

## 2018-01-04 ENCOUNTER — Encounter: Payer: Self-pay | Admitting: Family Medicine

## 2018-01-04 ENCOUNTER — Ambulatory Visit (INDEPENDENT_AMBULATORY_CARE_PROVIDER_SITE_OTHER): Payer: Medicare HMO | Admitting: Family Medicine

## 2018-01-04 ENCOUNTER — Other Ambulatory Visit: Payer: Self-pay | Admitting: Family Medicine

## 2018-01-04 ENCOUNTER — Telehealth: Payer: Self-pay | Admitting: Family Medicine

## 2018-01-04 VITALS — BP 130/84 | HR 92 | Temp 97.7°F | Resp 16 | Ht 71.5 in | Wt 250.6 lb

## 2018-01-04 DIAGNOSIS — R739 Hyperglycemia, unspecified: Secondary | ICD-10-CM

## 2018-01-04 DIAGNOSIS — E782 Mixed hyperlipidemia: Secondary | ICD-10-CM

## 2018-01-04 DIAGNOSIS — Z23 Encounter for immunization: Secondary | ICD-10-CM | POA: Diagnosis not present

## 2018-01-04 DIAGNOSIS — Z0000 Encounter for general adult medical examination without abnormal findings: Secondary | ICD-10-CM | POA: Diagnosis not present

## 2018-01-04 LAB — LIPID PANEL
CHOL/HDL RATIO: 7
Cholesterol: 287 mg/dL — ABNORMAL HIGH (ref 0–200)
HDL: 40.3 mg/dL (ref >39.00)
NONHDL: 246.97
TRIGLYCERIDES: 257 mg/dL — AB (ref 0.0–149.0)
VLDL: 51.4 mg/dL — ABNORMAL HIGH (ref 0.0–40.0)

## 2018-01-04 LAB — COMPREHENSIVE METABOLIC PANEL
ALT: 35 U/L (ref 0–53)
AST: 22 U/L (ref 0–37)
Albumin: 4.1 g/dL (ref 3.5–5.2)
Alkaline Phosphatase: 56 U/L (ref 39–117)
BUN: 12 mg/dL (ref 6–23)
CALCIUM: 9.6 mg/dL (ref 8.4–10.5)
CHLORIDE: 106 meq/L (ref 96–112)
CO2: 30 meq/L (ref 19–32)
CREATININE: 1.02 mg/dL (ref 0.40–1.50)
GFR: 81.52 mL/min (ref >60.00)
GLUCOSE: 133 mg/dL — AB (ref 70–99)
Potassium: 4.4 mEq/L (ref 3.5–5.1)
Sodium: 143 mEq/L (ref 135–145)
Total Bilirubin: 0.5 mg/dL (ref 0.2–1.2)
Total Protein: 6.9 g/dL (ref 6.0–8.3)

## 2018-01-04 LAB — LDL CHOLESTEROL, DIRECT: LDL DIRECT: 206 mg/dL

## 2018-01-04 MED ORDER — ATORVASTATIN CALCIUM 40 MG PO TABS: 40.0000 mg | ORAL_TABLET | Freq: Every day | ORAL | 3 refills | Status: DC

## 2018-01-04 NOTE — Telephone Encounter (Signed)
See results notes. 

## 2018-01-04 NOTE — Progress Notes (Signed)
Chief Complaint  Patient presents with  . Annual Exam    Here for annual physical exam   Well Male Rosser Collington is here for a complete physical.   His last physical was >1 year ago.  Current diet: in general, a "healthy" diet.  Current exercise: basketball 2x/week, walk Weight trend: stable Does pt snore? No apneic episodes, does snore Daytime fatigue? No. Seat belt? Yes.    Health maintenance Shingrix- Yes Colonoscopy- Yes - 01/04/18 Tetanus- Yes HIV- Yes Hep C- Yes Prostate cancer screening- No   Past Medical History:  Diagnosis Date  . Depression   . Dyslexia   . History of chicken pox       Past Surgical History:  Procedure Laterality Date  . NO PAST SURGERIES      Medications  Current Outpatient Medications on File Prior to Visit  Medication Sig Dispense Refill  . FLUoxetine (PROZAC) 20 MG capsule Take 1 capsule (20 mg total) by mouth daily. 90 capsule 1  . levocetirizine (XYZAL) 5 MG tablet Take 1 tablet (5 mg total) by mouth every evening. 30 tablet 1   Allergies No Known Allergies  Family History Family History  Problem Relation Age of Onset  . COPD Mother   . Hypertension Mother   . Diabetes Mother   . Cancer Father        Prostate  . Diabetes Father   . Hypertension Father   . Cancer Brother        Kidney  . Hypertension Brother   . Hyperlipidemia Brother   . Diabetes Brother   . Hyperlipidemia Maternal Uncle   . Hypertension Maternal Uncle   . Diabetes Maternal Uncle   . Heart disease Maternal Uncle   . Diabetes Paternal Grandmother   . Heart disease Paternal Grandmother   . Hypertension Paternal Grandmother   . Glaucoma Paternal Grandmother     Review of Systems: Constitutional:  no fevers Eye:  no recent significant change in vision Ear/Nose/Mouth/Throat:  Ears:  no hearing loss Nose/Mouth/Throat:  no complaints of nasal congestion, no sore throat Cardiovascular:  no chest pain, no palpitations Respiratory:  no cough and no  shortness of breath Gastrointestinal:  no abdominal pain, no change in bowel habits GU:  Male: negative for dysuria, frequency, and incontinence and negative for prostate symptoms Musculoskeletal/Extremities:  no pain, redness, or swelling of the joints Integumentary (Skin/Breast):  no abnormal skin lesions reported Neurologic:  no headaches Endocrine: No unexpected weight changes Hematologic/Lymphatic:  no abnormal bleeding  Exam BP 130/84 (BP Location: Left Arm, Patient Position: Sitting, Cuff Size: Large)   Pulse 92   Temp 97.7 F (36.5 C) (Oral)   Resp 16   Ht 5' 11.5" (1.816 m)   Wt 250 lb 9.6 oz (113.7 kg)   SpO2 98%   BMI 34.46 kg/m  General:  well developed, well nourished, in no apparent distress Skin:  no significant moles, warts, or growths Head:  no masses, lesions, or tenderness Eyes:  pupils equal and round, sclera anicteric without injection Ears:  canals without lesions, TMs shiny without retraction, no obvious effusion, no erythema Nose:  nares patent, septum midline, mucosa normal Throat/Pharynx:  lips and gingiva without lesion; tongue and uvula midline; non-inflamed pharynx; no exudates or postnasal drainage Neck: neck supple without adenopathy, thyromegaly, or masses Lungs:  clear to auscultation, breath sounds equal bilaterally, no respiratory distress Cardio:  regular rate and rhythm, no LE edema, no bruits Abdomen:  abdomen soft, nontender; bowel sounds  normal; no masses or organomegaly Genital (male): circumcised penis, no lesions or discharge; testes present bilaterally without masses or tenderness Rectal: Deferred Musculoskeletal:  symmetrical muscle groups noted without atrophy or deformity Extremities:  no clubbing, cyanosis, or edema, no deformities, no skin discoloration Neuro:  gait normal; deep tendon reflexes normal and symmetric Psych: well oriented with normal range of affect and appropriate judgment/insight  Assessment and Plan  Well adult  exam - Plan: Comprehensive metabolic panel, Lipid panel  Need for shingles vaccine - Plan: Varicella-zoster vaccine IM (Shingrix)   Well 52 y.o. male. Counseled on diet and exercise. Counseled on risks and benefits of prostate cancer screening with PSA. The patient agrees to forego testing. Immunizations, labs, and further orders as above. Follow up 6 mo. The patient voiced understanding and agreement to the plan.  [place], DO 01/04/18 8:27 AM

## 2018-01-04 NOTE — Telephone Encounter (Signed)
Copied from [place] [id number]. Topic: Quick Communication - See Telephone Encounter >> Jan 04, 2018  3:59 PM Robina Ade, Helene Kelp D wrote: CRM for notification. See Telephone encounter for: 01/04/18. Patient called and was returning office missed call he had about his lab results. There was no CRM for our triage nurse to give results. Please call patient back, thanks.

## 2018-01-04 NOTE — Patient Instructions (Addendum)
Stay active. Keep the diet clean.  1-2 days to get the results of your labs back.   Let us know if you need anything.

## 2018-01-08 ENCOUNTER — Other Ambulatory Visit (INDEPENDENT_AMBULATORY_CARE_PROVIDER_SITE_OTHER): Payer: Medicare HMO

## 2018-01-08 DIAGNOSIS — R739 Hyperglycemia, unspecified: Secondary | ICD-10-CM

## 2018-01-08 LAB — HEMOGLOBIN A1C: HEMOGLOBIN A1C: 6.4 % (ref 4.6–6.5)

## 2018-01-31 DIAGNOSIS — H59811 Chorioretinal scars after surgery for detachment, right eye: Secondary | ICD-10-CM | POA: Diagnosis not present

## 2018-01-31 DIAGNOSIS — H43811 Vitreous degeneration, right eye: Secondary | ICD-10-CM | POA: Diagnosis not present

## 2018-02-02 DIAGNOSIS — E7800 Pure hypercholesterolemia, unspecified: Secondary | ICD-10-CM | POA: Diagnosis not present

## 2018-02-02 DIAGNOSIS — R69 Illness, unspecified: Secondary | ICD-10-CM | POA: Diagnosis not present

## 2018-02-02 DIAGNOSIS — Z638 Other specified problems related to primary support group: Secondary | ICD-10-CM | POA: Diagnosis not present

## 2018-02-02 DIAGNOSIS — R030 Elevated blood-pressure reading, without diagnosis of hypertension: Secondary | ICD-10-CM | POA: Diagnosis not present

## 2018-02-02 DIAGNOSIS — Z79899 Other long term (current) drug therapy: Secondary | ICD-10-CM | POA: Diagnosis not present

## 2018-02-12 MED ORDER — FLUOXETINE HCL 20 MG PO CAPS
20.00 | ORAL_CAPSULE | ORAL | Status: DC
Start: 2018-02-11 — End: 2018-02-12

## 2018-02-14 ENCOUNTER — Other Ambulatory Visit: Payer: Self-pay | Admitting: Family Medicine

## 2018-02-14 DIAGNOSIS — J301 Allergic rhinitis due to pollen: Secondary | ICD-10-CM

## 2018-02-14 MED ORDER — LEVOCETIRIZINE DIHYDROCHLORIDE 5 MG PO TABS: 5.0000 mg | ORAL_TABLET | Freq: Every evening | ORAL | 0 refills | Status: DC

## 2018-02-14 MED ORDER — FLUOXETINE HCL 20 MG PO CAPS: 20.0000 mg | ORAL_CAPSULE | Freq: Every day | ORAL | 0 refills | Status: DC

## 2018-02-14 MED ORDER — ATORVASTATIN CALCIUM 40 MG PO TABS: 40.0000 mg | ORAL_TABLET | Freq: Every day | ORAL | 0 refills | Status: DC

## 2018-02-20 ENCOUNTER — Telehealth: Payer: Self-pay | Admitting: *Deleted

## 2018-02-20 NOTE — Telephone Encounter (Signed)
Received Physician Orders from [facility]; forwarded to provider/SLS 06/25

## 2018-02-27 ENCOUNTER — Telehealth: Payer: Self-pay | Admitting: *Deleted

## 2018-02-27 NOTE — Telephone Encounter (Addendum)
Received FL2, personal care Physician Authorization and Care Plan, standing Orders for Medication & Treatment; forwarded to provider/SLS 07/03

## 2018-03-09 ENCOUNTER — Telehealth: Payer: Self-pay | Admitting: Family Medicine

## 2018-03-09 MED ORDER — FLUOXETINE HCL 20 MG PO CAPS: 20.0000 mg | ORAL_CAPSULE | Freq: Every day | ORAL | 3 refills | Status: DC

## 2018-03-09 MED ORDER — ATORVASTATIN CALCIUM 40 MG PO TABS: 40.0000 mg | ORAL_TABLET | Freq: Every day | ORAL | 3 refills | Status: DC

## 2018-03-09 NOTE — Telephone Encounter (Signed)
Copied from [place] [id number]. Topic: Quick Communication - See Telephone Encounter >> Mar 09, 2018 10:04 AM Conception Chancy, NT wrote: CRM for notification. See Telephone encounter for: 03/09/18.  Patient is needing a refill on atorvastatin (LIPITOR) 40 MG tablet  and FLUoxetine (PROZAC) 20 MG capsule.   [facility] [phone] - [place], [place] - [address] [place] [address] Phone: [phone] Fax: [phone]

## 2018-03-15 DIAGNOSIS — H25013 Cortical age-related cataract, bilateral: Secondary | ICD-10-CM | POA: Diagnosis not present

## 2018-03-15 DIAGNOSIS — H35371 Puckering of macula, right eye: Secondary | ICD-10-CM | POA: Diagnosis not present

## 2018-03-15 DIAGNOSIS — H2513 Age-related nuclear cataract, bilateral: Secondary | ICD-10-CM | POA: Diagnosis not present

## 2018-03-15 DIAGNOSIS — H2511 Age-related nuclear cataract, right eye: Secondary | ICD-10-CM | POA: Diagnosis not present

## 2018-03-15 DIAGNOSIS — H33001 Unspecified retinal detachment with retinal break, right eye: Secondary | ICD-10-CM | POA: Diagnosis not present

## 2018-03-15 DIAGNOSIS — H25043 Posterior subcapsular polar age-related cataract, bilateral: Secondary | ICD-10-CM | POA: Diagnosis not present

## 2018-03-15 DIAGNOSIS — H18413 Arcus senilis, bilateral: Secondary | ICD-10-CM | POA: Diagnosis not present

## 2018-03-20 DIAGNOSIS — J309 Allergic rhinitis, unspecified: Secondary | ICD-10-CM | POA: Diagnosis not present

## 2018-03-20 DIAGNOSIS — E669 Obesity, unspecified: Secondary | ICD-10-CM | POA: Diagnosis not present

## 2018-03-20 DIAGNOSIS — F419 Anxiety disorder, unspecified: Secondary | ICD-10-CM | POA: Diagnosis not present

## 2018-03-20 DIAGNOSIS — G8929 Other chronic pain: Secondary | ICD-10-CM | POA: Diagnosis not present

## 2018-03-20 DIAGNOSIS — R509 Fever, unspecified: Secondary | ICD-10-CM | POA: Diagnosis not present

## 2018-03-20 DIAGNOSIS — E785 Hyperlipidemia, unspecified: Secondary | ICD-10-CM | POA: Diagnosis not present

## 2018-03-20 DIAGNOSIS — Z6834 Body mass index (BMI) 34.0-34.9, adult: Secondary | ICD-10-CM | POA: Diagnosis not present

## 2018-03-20 DIAGNOSIS — R69 Illness, unspecified: Secondary | ICD-10-CM | POA: Diagnosis not present

## 2018-03-20 DIAGNOSIS — K5900 Constipation, unspecified: Secondary | ICD-10-CM | POA: Diagnosis not present

## 2018-03-20 DIAGNOSIS — K219 Gastro-esophageal reflux disease without esophagitis: Secondary | ICD-10-CM | POA: Diagnosis not present

## 2018-04-02 ENCOUNTER — Other Ambulatory Visit (INDEPENDENT_AMBULATORY_CARE_PROVIDER_SITE_OTHER): Payer: Medicare HMO

## 2018-04-02 DIAGNOSIS — E782 Mixed hyperlipidemia: Secondary | ICD-10-CM | POA: Diagnosis not present

## 2018-04-02 DIAGNOSIS — R739 Hyperglycemia, unspecified: Secondary | ICD-10-CM | POA: Diagnosis not present

## 2018-04-02 LAB — HEPATIC FUNCTION PANEL
ALT: 57 U/L — AB (ref 0–53)
AST: 25 U/L (ref 0–37)
Albumin: 4.4 g/dL (ref 3.5–5.2)
Alkaline Phosphatase: 68 U/L (ref 39–117)
BILIRUBIN TOTAL: 1.1 mg/dL (ref 0.2–1.2)
Bilirubin, Direct: 0.2 mg/dL (ref 0.0–0.3)
TOTAL PROTEIN: 6.8 g/dL (ref 6.0–8.3)

## 2018-04-02 LAB — LIPID PANEL
CHOLESTEROL: 171 mg/dL (ref 0–200)
HDL: 38.9 mg/dL — ABNORMAL LOW (ref >39.00)
LDL CALC: 98 mg/dL (ref 0–99)
NonHDL: 132.59
TRIGLYCERIDES: 175 mg/dL — AB (ref 0.0–149.0)
Total CHOL/HDL Ratio: 4
VLDL: 35 mg/dL (ref 0.0–40.0)

## 2018-04-04 ENCOUNTER — Telehealth: Payer: Self-pay | Admitting: Family Medicine

## 2018-04-04 ENCOUNTER — Other Ambulatory Visit: Payer: Self-pay | Admitting: Family Medicine

## 2018-04-04 DIAGNOSIS — R740 Nonspecific elevation of levels of transaminase and lactic acid dehydrogenase [LDH]: Principal | ICD-10-CM

## 2018-04-04 DIAGNOSIS — E785 Hyperlipidemia, unspecified: Secondary | ICD-10-CM | POA: Diagnosis not present

## 2018-04-04 DIAGNOSIS — E669 Obesity, unspecified: Secondary | ICD-10-CM | POA: Diagnosis not present

## 2018-04-04 DIAGNOSIS — H2511 Age-related nuclear cataract, right eye: Secondary | ICD-10-CM | POA: Diagnosis not present

## 2018-04-04 DIAGNOSIS — Z79899 Other long term (current) drug therapy: Secondary | ICD-10-CM | POA: Diagnosis not present

## 2018-04-04 DIAGNOSIS — R69 Illness, unspecified: Secondary | ICD-10-CM | POA: Diagnosis not present

## 2018-04-04 DIAGNOSIS — Z6833 Body mass index (BMI) 33.0-33.9, adult: Secondary | ICD-10-CM | POA: Diagnosis not present

## 2018-04-04 DIAGNOSIS — R7401 Elevation of levels of liver transaminase levels: Secondary | ICD-10-CM

## 2018-04-04 NOTE — Telephone Encounter (Signed)
Copied from [place] [id number] [id number]. Topic: Quick Communication - Lab Results >> Apr 03, 2018  1:05 PM Synthia Innocent wrote: Returning call  >> Apr 03, 2018  1:12 PM Synthia Innocent wrote: Prior note error, requesting lab results error

## 2018-04-06 ENCOUNTER — Other Ambulatory Visit: Payer: Self-pay | Admitting: Family Medicine

## 2018-04-06 DIAGNOSIS — J301 Allergic rhinitis due to pollen: Secondary | ICD-10-CM

## 2018-04-09 ENCOUNTER — Other Ambulatory Visit: Payer: Self-pay

## 2018-04-09 ENCOUNTER — Other Ambulatory Visit: Payer: Self-pay | Admitting: Family Medicine

## 2018-04-09 DIAGNOSIS — J301 Allergic rhinitis due to pollen: Secondary | ICD-10-CM

## 2018-04-11 DIAGNOSIS — H2513 Age-related nuclear cataract, bilateral: Secondary | ICD-10-CM | POA: Diagnosis not present

## 2018-04-12 ENCOUNTER — Encounter: Payer: Self-pay | Admitting: Family Medicine

## 2018-04-12 ENCOUNTER — Ambulatory Visit (INDEPENDENT_AMBULATORY_CARE_PROVIDER_SITE_OTHER): Payer: Medicare HMO | Admitting: Family Medicine

## 2018-04-12 VITALS — BP 124/78 | HR 103 | Temp 97.6°F | Ht 73.0 in | Wt 254.1 lb

## 2018-04-12 DIAGNOSIS — R740 Nonspecific elevation of levels of transaminase and lactic acid dehydrogenase [LDH]: Secondary | ICD-10-CM

## 2018-04-12 DIAGNOSIS — R7303 Prediabetes: Secondary | ICD-10-CM

## 2018-04-12 DIAGNOSIS — R7401 Elevation of levels of liver transaminase levels: Secondary | ICD-10-CM

## 2018-04-12 DIAGNOSIS — E7800 Pure hypercholesterolemia, unspecified: Secondary | ICD-10-CM | POA: Diagnosis not present

## 2018-04-12 DIAGNOSIS — J301 Allergic rhinitis due to pollen: Secondary | ICD-10-CM | POA: Diagnosis not present

## 2018-04-12 MED ORDER — LEVOCETIRIZINE DIHYDROCHLORIDE 5 MG PO TABS: ORAL_TABLET | ORAL | 3 refills | Status: DC

## 2018-04-12 NOTE — Progress Notes (Addendum)
Chief Complaint  Patient presents with  . Follow-up    Subjective: Patient is a 52 y.o. male here for lab f/u.  Patient was diagnosed with prediabetes with an A1c of 6.4 several months ago.  He reports his diet is healthy overall.  He has not been as active since moving into assisted living.  He had an explosive argument with his father and was kicked out of the house.  He is compliant with his psychiatric medicines and not having any side effects.  He was supposed to be seeing psychiatry but is only seeing counseling right now.  No homicidal or suicidal ideation.   ROS: Psych: No HI   Past Medical History:  Diagnosis Date  . Depression   . Dyslexia   . History of chicken pox     Objective: BP 124/78 (BP Location: Left Arm, Patient Position: Sitting, Cuff Size: Large)   Pulse (!) 103   Temp 97.6 F (36.4 C) (Oral)   Ht 6\' 1"  (1.854 m)   Wt 254 lb 2 oz (115.3 kg)   SpO2 93%   BMI 33.53 kg/m  General: Awake, appears stated age Heart: RRR, no LE edema, no bruits Lungs: CTAB, no rales, wheezes or rhonchi. No accessory muscle use Psych: normal affect and mood  Assessment and Plan: Pure hypercholesterolemia  Prediabetes  Seasonal allergic rhinitis due to pollen - Plan: levocetirizine (XYZAL) 5 MG tablet  Elevated ALT measurement - Plan: Hepatic function panel  Doing well on Lipitor.  Counseled on diet and exercise.  We will recheck A1c at next visit as it has not been 90 days yet. Recheck hepatic function panel for elevated ALT.  If still elevated, will obtain right upper quadrant ultrasound. Given his recent outbreak, I would like him to see psychiatry.  He currently resides in [place].  I stressed that his assisted living facility has psychiatric resources in that area.  I did provide him with some psychiatric resources in this area.  It is a self-referral process and the patient was made aware of this. Follow-up in 3 months. The patient voiced understanding and  agreement to the plan.  [place], DO 04/12/18  4:37 PM

## 2018-04-12 NOTE — Patient Instructions (Signed)
I would like for you to get set up with psychiatry. Your assisted living facility should have resources in the [place] area.  Aim to do some physical exertion for 150 minutes per week. This is typically divided into 5 days per week, 30 minutes per day. The activity should be enough to get your heart rate up. Anything is better than nothing if you have time constraints.  Keep the diet clean. No more than a 2000 calorie diet.   [facility] [address] [place], [place] [address] [phone]  [facility] [address] [place], [place] [address] [phone]  [facility] [place], [place] [address] [phone]  [facility] [address], [place], [place], #[phone] [address], [place], [place], [place]  [facility] [place], [place], [place], [place]  [facility] [place], [place]  Call one of these offices sooner than later as it can take 2-3 months to get a new patient appointment.

## 2018-04-12 NOTE — Progress Notes (Signed)
Pre visit review using our clinic review tool, if applicable. No additional management support is needed unless otherwise documented below in the visit note. 

## 2018-04-13 LAB — HEPATIC FUNCTION PANEL
ALT: 51 U/L (ref 0–53)
AST: 23 U/L (ref 0–37)
Albumin: 4.4 g/dL (ref 3.5–5.2)
Alkaline Phosphatase: 64 U/L (ref 39–117)
BILIRUBIN TOTAL: 1.1 mg/dL (ref 0.2–1.2)
Bilirubin, Direct: 0.2 mg/dL (ref 0.0–0.3)
Total Protein: 6.8 g/dL (ref 6.0–8.3)

## 2018-05-09 DIAGNOSIS — H35371 Puckering of macula, right eye: Secondary | ICD-10-CM | POA: Diagnosis not present

## 2018-05-09 DIAGNOSIS — H59811 Chorioretinal scars after surgery for detachment, right eye: Secondary | ICD-10-CM | POA: Diagnosis not present

## 2018-05-09 DIAGNOSIS — H35351 Cystoid macular degeneration, right eye: Secondary | ICD-10-CM | POA: Diagnosis not present

## 2018-05-23 DIAGNOSIS — H26491 Other secondary cataract, right eye: Secondary | ICD-10-CM | POA: Diagnosis not present

## 2018-05-23 DIAGNOSIS — H33001 Unspecified retinal detachment with retinal break, right eye: Secondary | ICD-10-CM | POA: Diagnosis not present

## 2018-05-23 DIAGNOSIS — R7309 Other abnormal glucose: Secondary | ICD-10-CM | POA: Diagnosis not present

## 2018-05-23 DIAGNOSIS — H35371 Puckering of macula, right eye: Secondary | ICD-10-CM | POA: Diagnosis not present

## 2018-05-23 DIAGNOSIS — H18413 Arcus senilis, bilateral: Secondary | ICD-10-CM | POA: Diagnosis not present

## 2018-06-09 IMAGING — CR DG FINGER INDEX 2+V*R*
3 series · 3 of 3 positions shown · non-contrast
Comparison: None

CLINICAL DATA: Injured RIGHT index finger DIP joint playing
basketball on 12/18/2016, with pain, redness and stiffness

EXAM:
RIGHT INDEX FINGER 2+V

[x finger pa right]
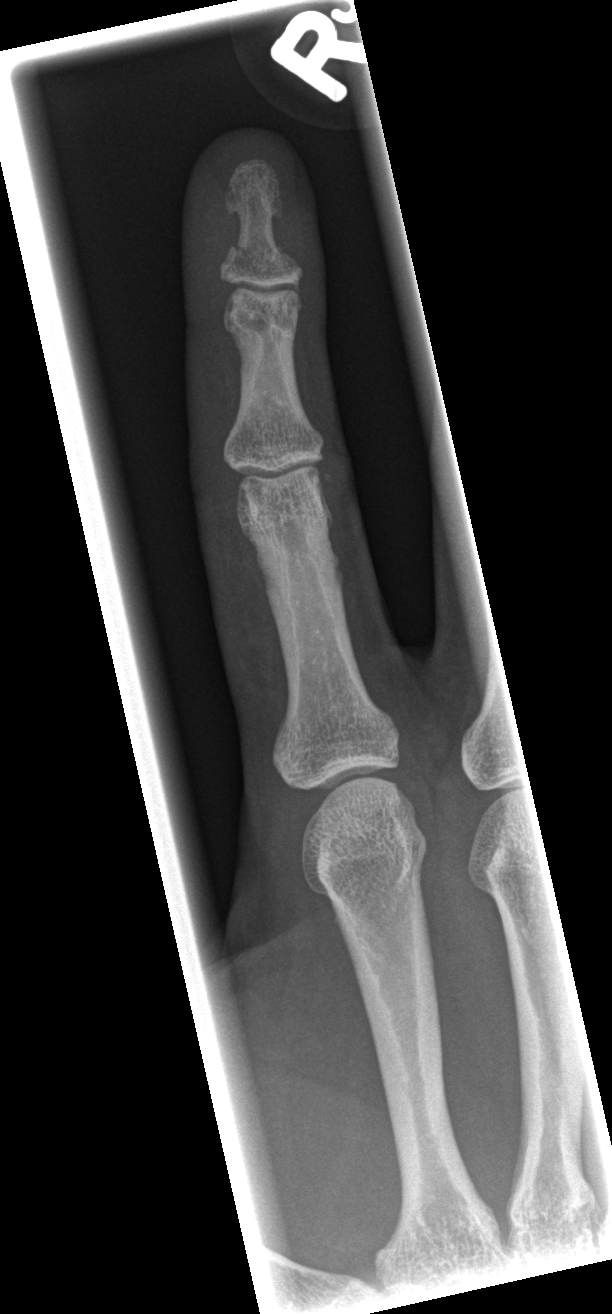

[x finger obl. right]
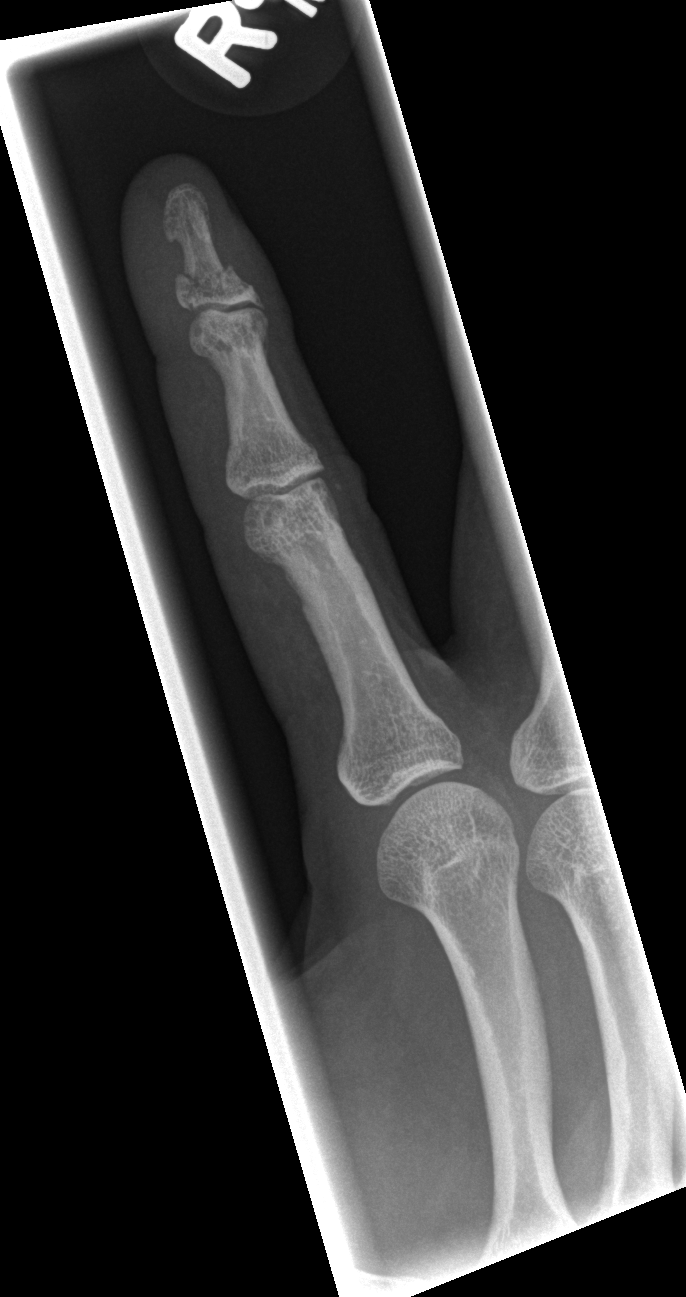

[x finger lateral right]
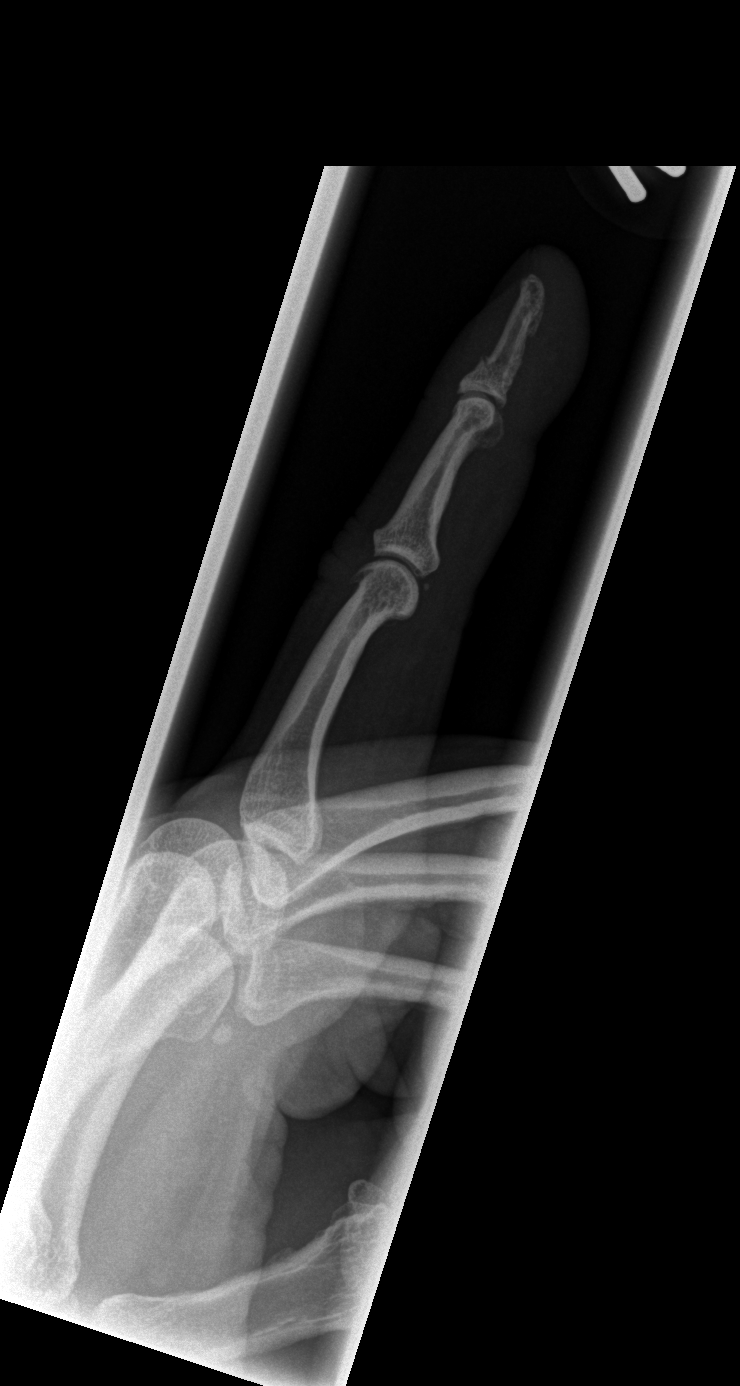

[3 of 3 positions shown; findings below may reference images not displayed]

FINDINGS: Osseous mineralization normal.

Joint spaces preserved.

Oblique nondisplaced comminuted fracture at base of distal phalanx
RIGHT index finger with intra-articular extension at the DIP joint.

Associated spur formation at dorsal margin of DIP joint as well as
at the head of the proximal phalanx at the PIP joint.

No additional fracture, dislocation or bone destruction.
IMPRESSION: Comminuted nondisplaced intra-articular fracture at base of distal
phalanx RIGHT index finger.

## 2018-06-13 ENCOUNTER — Ambulatory Visit (INDEPENDENT_AMBULATORY_CARE_PROVIDER_SITE_OTHER): Payer: Medicare HMO | Admitting: Family Medicine

## 2018-06-13 ENCOUNTER — Encounter: Payer: Self-pay | Admitting: Family Medicine

## 2018-06-13 VITALS — BP 124/78 | HR 84 | Temp 98.1°F | Ht 73.0 in | Wt 256.1 lb

## 2018-06-13 DIAGNOSIS — H7291 Unspecified perforation of tympanic membrane, right ear: Secondary | ICD-10-CM | POA: Diagnosis not present

## 2018-06-13 MED ORDER — CIPROFLOXACIN-DEXAMETHASONE 0.3-0.1 % OT SUSP: 4.0000 [drp] | Freq: Two times a day (BID) | OTIC | 0 refills | Status: DC

## 2018-06-13 NOTE — Progress Notes (Signed)
Pre visit review using our clinic review tool, if applicable. No additional management support is needed unless otherwise documented below in the visit note. 

## 2018-06-13 NOTE — Patient Instructions (Signed)
Don't go swimming or let water in your ears.  Do not use Q-tips.  Use a cotton ball in your ear after placing the drops.   Let us know if you need anything.

## 2018-06-13 NOTE — Progress Notes (Signed)
Chief Complaint  Patient presents with  . Ear Pain    right    Pt is here for right ear pain. Duration: 3 days Progression: unchanged Associated symptoms: Felt like he had water in ear after shower and stuck finger in ear, felt a large pop followed by pain and drainage, +drainage from ear, slight decrease in hearing Has been using Q tips. Denies: URI s/s's, fevers, bleeding Treatment to date: Tylenol, Q tips  ROS:  HEENT: +ear pain Costitutional: Denies fevers  Past Medical History:  Diagnosis Date  . Depression   . Dyslexia   . History of chicken pox    Family History  Problem Relation Age of Onset  . COPD Mother   . Hypertension Mother   . Diabetes Mother   . Cancer Father        Prostate  . Diabetes Father   . Hypertension Father   . Cancer Brother        Kidney  . Hypertension Brother   . Hyperlipidemia Brother   . Diabetes Brother   . Hyperlipidemia Maternal Uncle   . Hypertension Maternal Uncle   . Diabetes Maternal Uncle   . Heart disease Maternal Uncle   . Diabetes Paternal Grandmother   . Heart disease Paternal Grandmother   . Hypertension Paternal Grandmother   . Glaucoma Paternal Grandmother      BP 124/78 (BP Location: Left Arm, Patient Position: Sitting, Cuff Size: Normal)   Pulse 84   Temp 98.1 F (36.7 C) (Oral)   Ht 6\' 1"  (1.854 m)   Wt 256 lb 2 oz (116.2 kg)   SpO2 94%   BMI 33.79 kg/m  General: Awake, alert, appearing stated age HEENT:  L ear- Canal patent without drainage or erythema, TM is neg R ear- TM is obstructed from full view by hematoma on floor of canal, no erythema, there is moisture over TM, there is purulent drainage in canal, no erythema Nose- nares patent and without discharge Mouth- Lips, gums and dentition unremarkable, pharynx is without erythema or exudate Neck: No adenopathy Lungs: Normal effort, no accessory muscle use Psych: Age appropriate judgment and insight, normal mood and affect  Tympanic membrane  perforation, right - Plan: ciprofloxacin-dexamethasone (CIPRODEX) OTIC suspension  Orders as above. Sent message to pharm to sub with Cipro drops alone. Cotton ball after use. I suspect a TM perf given hx despite not being able to visualize 2/2 hematoma, likely from Q tip use. AOE is also likely, but tx would not be changed.  Don't submerge head in water.  F/u in 2 weeks if symptoms fail to improve, sooner if needed. Pt voiced understanding and agreement to the plan.  [place], DO 06/13/18 10:56 AM

## 2018-06-27 ENCOUNTER — Telehealth: Payer: Self-pay | Admitting: Family Medicine

## 2018-06-27 NOTE — Telephone Encounter (Signed)
Called and informed the patient of PCP instructions. Scheduled appt next week 11/6

## 2018-06-27 NOTE — Telephone Encounter (Signed)
Copied from [place] [phone] [phone]. Topic: General - Other >> Jun 27, 2018  1:04 PM Mcneil, Ja-Kwan wrote: Reason for CRM: Pt states he lives at an assistant living facility and he was taken off of the ciprofloxacin-dexamethasone (CIPRODEX) OTIC suspension but his ear is still stopped up and he would like to be put back on the drops. Pt requests another Rx for the ciprofloxacin-dexamethasone (CIPRODEX) OTIC suspension be sent to [facility], [place] - [address]  [phone] (Phone) [phone] (Fax)  Pt Cb# [phone]

## 2018-06-27 NOTE — Telephone Encounter (Signed)
If he went thru the entire bottle, he likely doesn't need any more. I think if he is still having issues, he should be seen. Please schedule if so. TY.

## 2018-07-04 ENCOUNTER — Ambulatory Visit: Payer: Self-pay | Admitting: Family Medicine

## 2018-07-05 ENCOUNTER — Ambulatory Visit: Payer: Self-pay | Admitting: Family Medicine

## 2018-07-09 ENCOUNTER — Encounter: Payer: Self-pay | Admitting: Family Medicine

## 2018-07-09 ENCOUNTER — Ambulatory Visit (INDEPENDENT_AMBULATORY_CARE_PROVIDER_SITE_OTHER): Payer: Medicare HMO | Admitting: Family Medicine

## 2018-07-09 VITALS — BP 130/86 | HR 76 | Temp 98.0°F | Ht 74.0 in | Wt 259.4 lb

## 2018-07-09 DIAGNOSIS — H6690 Otitis media, unspecified, unspecified ear: Secondary | ICD-10-CM

## 2018-07-09 DIAGNOSIS — R7303 Prediabetes: Secondary | ICD-10-CM | POA: Diagnosis not present

## 2018-07-09 DIAGNOSIS — F339 Major depressive disorder, recurrent, unspecified: Secondary | ICD-10-CM

## 2018-07-09 DIAGNOSIS — E7800 Pure hypercholesterolemia, unspecified: Secondary | ICD-10-CM | POA: Diagnosis not present

## 2018-07-09 DIAGNOSIS — R69 Illness, unspecified: Secondary | ICD-10-CM | POA: Diagnosis not present

## 2018-07-09 LAB — HEMOGLOBIN A1C: Hgb A1c MFr Bld: 6.3 % (ref 4.6–6.5)

## 2018-07-09 LAB — LIPID PANEL
CHOLESTEROL: 182 mg/dL (ref 0–200)
HDL: 41.6 mg/dL (ref >39.00)
LDL Cholesterol: 107 mg/dL — ABNORMAL HIGH (ref 0–99)
NonHDL: 140.7
TRIGLYCERIDES: 167 mg/dL — AB (ref 0.0–149.0)
Total CHOL/HDL Ratio: 4
VLDL: 33.4 mg/dL (ref 0.0–40.0)

## 2018-07-09 MED ORDER — METHYLPREDNISOLONE ACETATE 80 MG/ML IJ SUSP
80.0000 mg | Freq: Once | INTRAMUSCULAR | Status: AC
  Administered 2018-07-09: 80 mg via INTRAMUSCULAR

## 2018-07-09 MED ORDER — AMOXICILLIN 875 MG PO TABS: 875.0000 mg | ORAL_TABLET | Freq: Two times a day (BID) | ORAL | 0 refills | Status: DC

## 2018-07-09 NOTE — Progress Notes (Signed)
Chief Complaint  Patient presents with  . Follow-up    Subjective: Hyperlipidemia Patient presents for Hyperlipidemia follow up. Currently taking Lipitor 40 mg/d and compliance with treatment thus far has been good. He denies myalgias. He is not adhering to a healthy diet. Exercise: None The patient is not known to have coexisting coronary artery disease.  Hx of prediabetes. Last A1c was 6.4.   R ear fullness This has been going on for around 4 weeks now. No pain or drainage. Tried ear drops to no avail. Is on Xyzal. No injury, does not use Q tips.   Hx of depression. Doing well on Prozac. Is compliant, no AE's.   ROS: Heart: Denies chest pain Lungs: Denies SOB  Past Medical History:  Diagnosis Date  . Depression   . Dyslexia   . History of chicken pox     Objective: BP 130/86 (BP Location: Left Arm, Patient Position: Sitting, Cuff Size: Large)   Pulse 76   Temp 98 F (36.7 C) (Oral)   Ht 6\' 2"  (1.88 m)   Wt 259 lb 6 oz (117.7 kg)   SpO2 93%   BMI 33.30 kg/m  General: Awake, appears stated age HEENT: MMM, Canals patent, L TM neg, R TM mildly retracted, erythematous, appears to be suppurative material behind TM Heart: RRR, no LE edema, no bruits Lungs: CTAB, no rales, wheezes or rhonchi. No accessory muscle use Psych: Age appropriate judgment and insight, normal affect and mood  Assessment and Plan: Pure hypercholesterolemia - Plan: Lipid panel  Prediabetes - Plan: Hemoglobin A1c  Subacute otitis media, unspecified otitis media type - Plan: amoxicillin (AMOXIL) 875 MG tablet  Depression, recurrent ([place])  Orders as above. Ck labs. Counseled on diet and exercise. Cont statin. Try steroid inj today. If no improvement over next 2 days, start amox.  F/u in 1 week to reck ear. Cancel appt if doing better. The patient voiced understanding and agreement to the plan.  [place], DO 07/09/18  9:16 AM

## 2018-07-09 NOTE — Progress Notes (Signed)
Pre visit review using our clinic review tool, if applicable. No additional management support is needed unless otherwise documented below in the visit note. 

## 2018-07-09 NOTE — Patient Instructions (Addendum)
Give [place] 2-3 business days to get the results of your labs back.   Keep the diet clean and stay active.  We are giving you a shot of a steroid to help your ear drain. If this does not help in the next 2 days, please take the antibiotic (amoxicillin).   Let us know if you need anything.

## 2018-07-09 NOTE — Addendum Note (Signed)
Addended by: Sharon Seller B on: 07/09/2018 09:21 AM   Modules accepted: Orders

## 2018-07-11 DIAGNOSIS — H35351 Cystoid macular degeneration, right eye: Secondary | ICD-10-CM | POA: Diagnosis not present

## 2018-07-11 DIAGNOSIS — H59811 Chorioretinal scars after surgery for detachment, right eye: Secondary | ICD-10-CM | POA: Diagnosis not present

## 2018-07-11 DIAGNOSIS — H35371 Puckering of macula, right eye: Secondary | ICD-10-CM | POA: Diagnosis not present

## 2018-07-12 ENCOUNTER — Other Ambulatory Visit: Payer: Self-pay | Admitting: Family Medicine

## 2018-07-16 ENCOUNTER — Ambulatory Visit: Payer: Medicare HMO | Admitting: Family Medicine

## 2018-08-19 ENCOUNTER — Other Ambulatory Visit: Payer: Self-pay | Admitting: Family Medicine

## 2018-09-05 ENCOUNTER — Other Ambulatory Visit: Payer: Self-pay | Admitting: Medical

## 2018-09-05 ENCOUNTER — Ambulatory Visit (INDEPENDENT_AMBULATORY_CARE_PROVIDER_SITE_OTHER): Payer: Medicare HMO | Admitting: Medical

## 2018-09-05 ENCOUNTER — Encounter: Payer: Self-pay | Admitting: Medical

## 2018-09-05 VITALS — BP 151/85 | HR 140 | Temp 99.1°F | Resp 16 | Ht 74.0 in | Wt 262.2 lb

## 2018-09-05 DIAGNOSIS — J40 Bronchitis, not specified as acute or chronic: Secondary | ICD-10-CM

## 2018-09-05 DIAGNOSIS — R059 Cough, unspecified: Secondary | ICD-10-CM

## 2018-09-05 DIAGNOSIS — R05 Cough: Secondary | ICD-10-CM | POA: Diagnosis not present

## 2018-09-05 DIAGNOSIS — J0100 Acute maxillary sinusitis, unspecified: Secondary | ICD-10-CM

## 2018-09-05 LAB — POC INFLUENZA A&B (BINAX/QUICKVUE)
Influenza A, POC: NEGATIVE
Influenza B, POC: NEGATIVE

## 2018-09-05 MED ORDER — AZITHROMYCIN 250 MG PO TABS: ORAL_TABLET | ORAL | 0 refills | Status: DC

## 2018-09-05 MED ORDER — FLUTICASONE PROPIONATE 50 MCG/ACT NA SUSP: 2.0000 | Freq: Every day | NASAL | 1 refills | Status: DC

## 2018-09-05 MED ORDER — BENZONATATE 100 MG PO CAPS: 100.0000 mg | ORAL_CAPSULE | Freq: Three times a day (TID) | ORAL | 0 refills | Status: DC | PRN

## 2018-09-05 NOTE — Progress Notes (Signed)
Subjective:    Patient ID: Ross Haynes, male    DOB: [birth year], 53 y.o.   MRN: [id number]  HPI  Pt in for evaluation.  He is in with cough, chest congestion, runny nose and nasal congestion. This started past Friday.   Pt has no body aches, no fever or chills. Eyes have been little watery.     Review of Systems  Constitutional: Negative for chills, diaphoresis, fatigue and fever.  HENT: Positive for congestion, sinus pressure and sinus pain. Negative for sore throat and tinnitus.   Eyes: Negative for discharge, redness, itching and visual disturbance.       Watery eyes.  Respiratory: Positive for cough. Negative for chest tightness, shortness of breath and wheezing.   Cardiovascular: Negative for chest pain and palpitations.  Gastrointestinal: Negative for anal bleeding.  Musculoskeletal: Negative for back pain and neck pain.  Skin: Negative for pallor.  Neurological: Negative for dizziness, seizures, syncope, weakness, light-headedness and headaches.  Hematological: Negative for adenopathy. Does not bruise/bleed easily.  Psychiatric/Behavioral: Negative for behavioral problems, confusion and sleep disturbance.    Past Medical History:  Diagnosis Date  . Depression   . Dyslexia   . History of chicken pox      Social History   Socioeconomic History  . Marital status: Single    Spouse name: Not on file  . Number of children: Not on file  . Years of education: Not on file  . Highest education level: Not on file  Occupational History  . Not on file  Social Needs  . Financial resource strain: Not on file  . Food insecurity:    Worry: Not on file    Inability: Not on file  . Transportation needs:    Medical: Not on file    Non-medical: Not on file  Tobacco Use  . Smoking status: Never Smoker  . Smokeless tobacco: Never Used  Substance and Sexual Activity  . Alcohol use: No  . Drug use: No  . Sexual activity: Not on file  Lifestyle  . Physical  activity:    Days per week: Not on file    Minutes per session: Not on file  . Stress: Not on file  Relationships  . Social connections:    Talks on phone: Not on file    Gets together: Not on file    Attends religious service: Not on file    Active member of club or organization: Not on file    Attends meetings of clubs or organizations: Not on file    Relationship status: Not on file  . Intimate partner violence:    Fear of current or ex partner: Not on file    Emotionally abused: Not on file    Physically abused: Not on file    Forced sexual activity: Not on file  Other Topics Concern  . Not on file  Social History Narrative  . Not on file    Past Surgical History:  Procedure Laterality Date  . NO PAST SURGERIES      Family History  Problem Relation Age of Onset  . COPD Mother   . Hypertension Mother   . Diabetes Mother   . Cancer Father        Prostate  . Diabetes Father   . Hypertension Father   . Cancer Brother        Kidney  . Hypertension Brother   . Hyperlipidemia Brother   . Diabetes Brother   . Hyperlipidemia Maternal  Uncle   . Hypertension Maternal Uncle   . Diabetes Maternal Uncle   . Heart disease Maternal Uncle   . Diabetes Paternal Grandmother   . Heart disease Paternal Grandmother   . Hypertension Paternal Grandmother   . Glaucoma Paternal Grandmother     No Known Allergies  Current Outpatient Medications on File Prior to Visit  Medication Sig Dispense Refill  . amoxicillin (AMOXIL) 875 MG tablet Take 1 tablet (875 mg total) by mouth 2 (two) times daily. 20 tablet 0  . atorvastatin (LIPITOR) 40 MG tablet Take 1 tablet (40 mg total) by mouth daily. 30 tablet 3  . FLUoxetine (PROZAC) 20 MG capsule TAKE 1 CAPSULE(20 MG) BY MOUTH DAILY 30 capsule 0  . levocetirizine (XYZAL) 5 MG tablet TAKE 1 TABLET(5 MG) BY MOUTH EVERY EVENING 90 tablet 3   No current facility-administered medications on file prior to visit.     BP (!) 151/85   Pulse (!)  140   Temp 99.1 F (37.3 C) (Oral)   Resp 16   Ht 6\' 2"  (1.88 m)   Wt 262 lb 3.2 oz (118.9 kg)   SpO2 94%   BMI 33.66 kg/m       Objective:   Physical Exam  General  Mental Status - Alert. General Appearance - Well groomed. Not in acute distress.  Skin Rashes- No Rashes.  HEENT Head- Normal. Ear Auditory Canal - Left- Normal. Right - Normal.Tympanic Membrane- Left- Normal. Right- Normal. Eye Sclera/Conjunctiva- Left- Normal. Right- Normal. Nose & Sinuses Nasal Mucosa- Left-  Boggy and Congested. Right-  Boggy and  Congested.Bilateral  No maxillary and  No frontal sinus pressure.(but reported some intermittently) Mouth & Throat Lips: Upper Lip- Normal: no dryness, cracking, pallor, cyanosis, or vesicular eruption. Lower Lip-Normal: no dryness, cracking, pallor, cyanosis or vesicular eruption. Buccal Mucosa- Bilateral- No Aphthous ulcers. Oropharynx- No Discharge or Erythema. Tonsils: Characteristics- Bilateral- No Erythema or Congestion. Size/Enlargement- Bilateral- No enlargement. Discharge- bilateral-None.  Neck Neck- Supple. No Masses.   Chest and Lung Exam Auscultation: Breath Sounds:-Clear even and unlabored.  Cardiovascular Auscultation:Rythm- Regular, rate and rhythm. Murmurs & Other Heart Sounds:Ausculatation of the heart reveal- No Murmurs.  Lymphatic Head & Neck General Head & Neck Lymphatics: Bilateral: Description- No Localized lymphadenopathy.       Assessment & Plan:   You appear to have bronchitis and sinusitis(more bronchitis). Rest hydrate and tylenol for fever. I am prescribing cough medicine benzonatate,and azithromycin antibiotic. For your nasal congestion rx flonase.  You should gradually get better. If not then notify us and would recommend a chest xray.  Your flu test was negative.  Follow up in 7-10 days or as needed  [employer], [employer]

## 2018-09-05 NOTE — Patient Instructions (Signed)
You appear to have bronchitis and sinusitis(more bronchitis). Rest hydrate and tylenol for fever. I am prescribing cough medicine benzonatate,and azithromycin antibiotic. For your nasal congestion rx flonase.  You should gradually get better. If not then notify us and would recommend a chest xray.  Your flu test was negative.  Follow up in 7-10 days or as needed

## 2018-09-16 ENCOUNTER — Other Ambulatory Visit: Payer: Self-pay | Admitting: Family Medicine

## 2018-09-25 ENCOUNTER — Telehealth: Payer: Self-pay | Admitting: *Deleted

## 2018-09-25 NOTE — Telephone Encounter (Signed)
Received Physician Orders from [facility]; forwarded to provider/SLS 01/28

## 2018-09-26 ENCOUNTER — Telehealth: Payer: Self-pay | Admitting: Family Medicine

## 2018-09-26 NOTE — Telephone Encounter (Signed)
Copied from [place] [phone]. Topic: General - Other >> Sep 25, 2018 11:42 AM Virl Axe D wrote: Reason for CRM: Ross Haynes the [facility] at the [facility] where pt resides stated she faxed over Diet Order and Standing Order forms on 09/21/18 and 09/24/18 to be signed by PCP and returned. She has not received forms back and they need to go in the pt's file. Please advise if forms were or were not received. CB# [phone]    Forms are in PCP's folder ready to be signed then will be faxed

## 2018-09-28 ENCOUNTER — Telehealth: Payer: Self-pay | Admitting: *Deleted

## 2018-09-28 NOTE — Telephone Encounter (Signed)
Received Physician Orders from [facility]; forwarded to provider/SLS 01/31

## 2018-10-05 DIAGNOSIS — Z8249 Family history of ischemic heart disease and other diseases of the circulatory system: Secondary | ICD-10-CM | POA: Diagnosis not present

## 2018-10-05 DIAGNOSIS — Z825 Family history of asthma and other chronic lower respiratory diseases: Secondary | ICD-10-CM | POA: Diagnosis not present

## 2018-10-05 DIAGNOSIS — R69 Illness, unspecified: Secondary | ICD-10-CM | POA: Diagnosis not present

## 2018-10-05 DIAGNOSIS — Z6834 Body mass index (BMI) 34.0-34.9, adult: Secondary | ICD-10-CM | POA: Diagnosis not present

## 2018-10-05 DIAGNOSIS — J309 Allergic rhinitis, unspecified: Secondary | ICD-10-CM | POA: Diagnosis not present

## 2018-10-05 DIAGNOSIS — E669 Obesity, unspecified: Secondary | ICD-10-CM | POA: Diagnosis not present

## 2018-10-05 DIAGNOSIS — E785 Hyperlipidemia, unspecified: Secondary | ICD-10-CM | POA: Diagnosis not present

## 2018-10-08 ENCOUNTER — Other Ambulatory Visit: Payer: Self-pay | Admitting: Family Medicine

## 2018-10-15 DIAGNOSIS — H35351 Cystoid macular degeneration, right eye: Secondary | ICD-10-CM | POA: Diagnosis not present

## 2018-10-15 DIAGNOSIS — H35371 Puckering of macula, right eye: Secondary | ICD-10-CM | POA: Diagnosis not present

## 2018-10-15 DIAGNOSIS — H59811 Chorioretinal scars after surgery for detachment, right eye: Secondary | ICD-10-CM | POA: Diagnosis not present

## 2018-11-14 ENCOUNTER — Other Ambulatory Visit: Payer: Self-pay | Admitting: Family Medicine

## 2018-11-22 ENCOUNTER — Other Ambulatory Visit: Payer: Self-pay | Admitting: Medical

## 2018-11-23 ENCOUNTER — Other Ambulatory Visit: Payer: Self-pay | Admitting: Family Medicine

## 2018-12-12 ENCOUNTER — Other Ambulatory Visit: Payer: Self-pay | Admitting: Family Medicine

## 2018-12-25 ENCOUNTER — Telehealth: Payer: Self-pay | Admitting: *Deleted

## 2018-12-25 NOTE — Telephone Encounter (Signed)
Copied from [place] [id number]. Topic: General - Inquiry >> Dec 25, 2018 11:10 AM Rainey Pines A wrote: Reason for CRM: Veronica from [facility] needs the [facility] papers she faxed over today to be completed and sent back to her.

## 2019-01-04 ENCOUNTER — Telehealth: Payer: Self-pay

## 2019-01-04 NOTE — Telephone Encounter (Signed)
Not sure where this needs to go. Ty.

## 2019-01-04 NOTE — Telephone Encounter (Signed)
Copied from [place] [phone]. Topic: General - Inquiry >> Jan 04, 2019  3:24 PM Virl Axe D wrote: Reason for CRM: Verdene Lennert with [facility],pt's place of residence stated that any calls addressing changes/cancellations with pt's appointments, those calls should be made to the main number listed [phone] asking for [place] or Loree Fee because they organize pt's transportation, not his parents and the message does not always get back to them in time. They are asking if this can please be noted on his chart for future appointments. They were unaware of the recent appointment change for pt.

## 2019-01-07 ENCOUNTER — Encounter: Payer: Self-pay | Admitting: Family Medicine

## 2019-01-07 NOTE — Telephone Encounter (Signed)
Called informed [facility] of appt////also confirmed patient currently has no Covid symptoms. FL2 form has been faxed

## 2019-01-09 ENCOUNTER — Encounter: Payer: Self-pay | Admitting: Family Medicine

## 2019-01-09 ENCOUNTER — Other Ambulatory Visit: Payer: Self-pay

## 2019-01-09 ENCOUNTER — Ambulatory Visit (INDEPENDENT_AMBULATORY_CARE_PROVIDER_SITE_OTHER): Payer: Medicare HMO | Admitting: Family Medicine

## 2019-01-09 VITALS — BP 118/72 | HR 80 | Temp 97.6°F | Ht 74.0 in | Wt 253.0 lb

## 2019-01-09 DIAGNOSIS — Z0000 Encounter for general adult medical examination without abnormal findings: Secondary | ICD-10-CM

## 2019-01-09 DIAGNOSIS — R7303 Prediabetes: Secondary | ICD-10-CM

## 2019-01-09 LAB — LIPID PANEL
Cholesterol: 176 mg/dL (ref 0–200)
HDL: 40.2 mg/dL (ref >39.00)
LDL Cholesterol: 99 mg/dL (ref 0–99)
NonHDL: 135.87
Total CHOL/HDL Ratio: 4
Triglycerides: 182 mg/dL — ABNORMAL HIGH (ref 0.0–149.0)
VLDL: 36.4 mg/dL (ref 0.0–40.0)

## 2019-01-09 LAB — COMPREHENSIVE METABOLIC PANEL
ALT: 74 U/L — ABNORMAL HIGH (ref 0–53)
AST: 33 U/L (ref 0–37)
Albumin: 4.4 g/dL (ref 3.5–5.2)
Alkaline Phosphatase: 81 U/L (ref 39–117)
BUN: 11 mg/dL (ref 6–23)
CO2: 31 mEq/L (ref 19–32)
Calcium: 9.1 mg/dL (ref 8.4–10.5)
Chloride: 106 mEq/L (ref 96–112)
Creatinine, Ser: 0.99 mg/dL (ref 0.40–1.50)
GFR: 79.08 mL/min (ref >60.00)
Glucose, Bld: 118 mg/dL — ABNORMAL HIGH (ref 70–99)
Potassium: 4.4 mEq/L (ref 3.5–5.1)
Sodium: 143 mEq/L (ref 135–145)
Total Bilirubin: 0.8 mg/dL (ref 0.2–1.2)
Total Protein: 6.8 g/dL (ref 6.0–8.3)

## 2019-01-09 LAB — HEMOGLOBIN A1C: Hgb A1c MFr Bld: 6.7 % — ABNORMAL HIGH (ref 4.6–6.5)

## 2019-01-09 NOTE — Patient Instructions (Signed)
Keep the diet clean and stay active.  Give us 2-3 business days to get the results of your labs back.   Let us know if you need anything.  

## 2019-01-09 NOTE — Progress Notes (Signed)
Chief Complaint  Patient presents with  . Annual Exam    Well Male Ross Haynes is here for a complete physical.   His last physical was >1 year ago.  Current diet: in general, diet is fair.  Current exercise: some walking Weight trend: stable Daytime fatigue? No. Seat belt? Yes.    Health maintenance Colonoscopy- Yes Tetanus- Yes HIV- Yes   Past Medical History:  Diagnosis Date  . Depression   . Dyslexia   . History of chicken pox       Past Surgical History:  Procedure Laterality Date  . NO PAST SURGERIES      Medications  Current Outpatient Medications on File Prior to Visit  Medication Sig Dispense Refill  . amoxicillin (AMOXIL) 875 MG tablet Take 1 tablet (875 mg total) by mouth 2 (two) times daily. 20 tablet 0  . atorvastatin (LIPITOR) 40 MG tablet TAKE 1 TABLET BY MOUTH EVERY DAY 90 tablet 0  . azithromycin (ZITHROMAX) 250 MG tablet Take 2 tablets by mouth on day 1, followed by 1 tablet by mouth daily for 4 days. 6 tablet 0  . benzonatate (TESSALON) 100 MG capsule Take 1 capsule (100 mg total) by mouth 3 (three) times daily as needed for cough. 30 capsule 0  . FLUoxetine (PROZAC) 20 MG capsule TAKE 1 CAPSULE(20 MG) BY MOUTH DAILY 30 capsule 0  . fluticasone (FLONASE) 50 MCG/ACT nasal spray SHAKE LIQUID AND USE 2 SPRAYS IN EACH NOSTRIL DAILY 48 g 3  . levocetirizine (XYZAL) 5 MG tablet TAKE 1 TABLET(5 MG) BY MOUTH EVERY EVENING 90 tablet 3   Allergies No Known Allergies  Family History Family History  Problem Relation Age of Onset  . COPD Mother   . Hypertension Mother   . Diabetes Mother   . Cancer Father        Prostate  . Diabetes Father   . Hypertension Father   . Cancer Brother        Kidney  . Hypertension Brother   . Hyperlipidemia Brother   . Diabetes Brother   . Hyperlipidemia Maternal Uncle   . Hypertension Maternal Uncle   . Diabetes Maternal Uncle   . Heart disease Maternal Uncle   . Diabetes Paternal Grandmother   . Heart  disease Paternal Grandmother   . Hypertension Paternal Grandmother   . Glaucoma Paternal Grandmother     Review of Systems: Constitutional:  no fevers Eye:  no recent significant change in vision Ear/Nose/Mouth/Throat:  Ears:  no hearing loss Nose/Mouth/Throat:  no complaints of nasal congestion, no sore throat Cardiovascular:  no chest pain, no palpitations Respiratory:  no cough and no shortness of breath Gastrointestinal:  no abdominal pain, no change in bowel habits GU:  Male: negative for dysuria, frequency, and incontinence and negative for prostate symptoms Musculoskeletal/Extremities:  no pain, redness, or swelling of the joints Integumentary (Skin/Breast):  no abnormal skin lesions reported Neurologic:  no headaches Endocrine: No unexpected weight changes Hematologic/Lymphatic:  no abnormal bleeding  Exam BP 118/72 (BP Location: Left Arm, Patient Position: Sitting, Cuff Size: Large)   Pulse 80   Temp 97.6 F (36.4 C) (Oral)   Ht 6\' 2"  (1.88 m)   Wt 253 lb (114.8 kg)   SpO2 96%   BMI 32.48 kg/m  General:  well developed, well nourished, in no apparent distress Skin:  no significant moles, warts, or growths Head:  no masses, lesions, or tenderness Eyes:  pupils equal and round, sclera anicteric without injection Ears:  canals without lesions, TMs shiny without retraction, no obvious effusion, no erythema Nose:  nares patent, septum midline, mucosa normal Throat/Pharynx:  lips and gingiva without lesion; tongue and uvula midline; non-inflamed pharynx; no exudates or postnasal drainage Neck: neck supple without adenopathy, thyromegaly, or masses Lungs:  clear to auscultation, breath sounds equal bilaterally, no respiratory distress Cardio:  regular rate and rhythm, no LE edema, no bruits Abdomen:  abdomen soft, nontender; bowel sounds normal; no masses or organomegaly Rectal: Deferred Musculoskeletal:  symmetrical muscle groups noted without atrophy or  deformity Extremities:  no clubbing, cyanosis, or edema, no deformities, no skin discoloration Neuro:  gait normal; deep tendon reflexes normal and symmetric Psych: well oriented with normal range of affect and appropriate judgment/insight  Assessment and Plan  Well adult exam - Plan: Lipid panel, Comprehensive metabolic panel  Prediabetes - Plan: Hemoglobin A1c   Well 53 y.o. male. Counseled on diet and exercise. Counseled on risks and benefits of prostate cancer screening with PSA. The patient agrees to forego testing. Immunizations, labs, and further orders as above. Follow up in 6 mo or prn. The patient voiced understanding and agreement to the plan.  [place], DO 01/09/19 11:10 AM

## 2019-01-11 ENCOUNTER — Other Ambulatory Visit: Payer: Self-pay | Admitting: Family Medicine

## 2019-01-16 ENCOUNTER — Other Ambulatory Visit: Payer: Self-pay

## 2019-01-16 ENCOUNTER — Ambulatory Visit (INDEPENDENT_AMBULATORY_CARE_PROVIDER_SITE_OTHER): Payer: Medicare HMO | Admitting: Family Medicine

## 2019-01-16 ENCOUNTER — Encounter: Payer: Self-pay | Admitting: Family Medicine

## 2019-01-16 VITALS — BP 108/62 | HR 105 | Temp 98.1°F | Ht 73.0 in | Wt 252.0 lb

## 2019-01-16 DIAGNOSIS — Z0279 Encounter for issue of other medical certificate: Secondary | ICD-10-CM

## 2019-01-16 DIAGNOSIS — E119 Type 2 diabetes mellitus without complications: Secondary | ICD-10-CM

## 2019-01-16 DIAGNOSIS — R69 Illness, unspecified: Secondary | ICD-10-CM | POA: Diagnosis not present

## 2019-01-16 DIAGNOSIS — Z23 Encounter for immunization: Secondary | ICD-10-CM | POA: Diagnosis not present

## 2019-01-16 LAB — GLUCOSE, POCT (MANUAL RESULT ENTRY): POC Glucose: 118 mg/dl — AB (ref 70–99)

## 2019-01-16 MED ORDER — ONETOUCH VERIO FLEX SYSTEM W/DEVICE KIT: PACK | 0 refills | Status: AC

## 2019-01-16 MED ORDER — ONETOUCH DELICA LANCETS 30G MISC: 1.0000 | Freq: Every day | 2 refills | Status: DC

## 2019-01-16 MED ORDER — GLUCOSE BLOOD VI STRP: ORAL_STRIP | 3 refills | Status: DC

## 2019-01-16 NOTE — Addendum Note (Signed)
Addended by: Sharon Seller B on: 01/16/2019 02:43 PM   Modules accepted: Orders

## 2019-01-16 NOTE — Progress Notes (Signed)
Chief Complaint  Patient presents with  . Results    Subjective: Patient is a 53 y.o. male here for results.  A1c found to be 6.7. Diet is poor. He does have an eye provider. On statin. Some walking, less than when gym was open. No urinary or skin complaints.    ROS: Skin: No lesions on feet  Past Medical History:  Diagnosis Date  . Depression   . Dyslexia   . History of chicken pox     Objective: BP 108/62 (BP Location: Left Arm, Patient Position: Sitting, Cuff Size: Large)   Pulse (!) 105   Temp 98.1 F (36.7 C) (Oral)   Ht 6\' 1"  (1.854 m)   Wt 252 lb (114.3 kg)   SpO2 96%   BMI 33.25 kg/m  General: Awake, appears stated age Heart: DP pulses 2+ b/l Skin: no lesions on feet Lungs: CTAB, no rales, wheezes or rhonchi. No accessory muscle use Psych: Normal affect and mood  Assessment and Plan: Diabetes mellitus without complication ([place]) - Plan: Microalbumin / creatinine urine ratio, HM DIABETES FOOT EXAM  Orders as above. Glucometer education provided. One was called in, doesn't necessary need to ck at this point. PCV23 today. On statin. BP controlled.  F/u in 3 mo.  The patient voiced understanding and agreement to the plan.  [place], DO  01/16/19  2:25 PM

## 2019-01-16 NOTE — Addendum Note (Signed)
Addended by: Sharon Seller B on: 01/16/2019 02:47 PM   Modules accepted: Orders

## 2019-01-16 NOTE — Patient Instructions (Addendum)
Keep the diet clean and stay active.  Give [place] 2-3 business days to get the results of your urine testing back.   Let eye provider know about your new diagnosis so they can do a dedicated exam on your eyes to look for diabetes related complications.  Let us know if you need anything.  Healthy Eating Plan Many factors influence your heart health, including eating and exercise habits. Heart (coronary) risk increases with abnormal blood fat (lipid) levels. Heart-healthy meal planning includes limiting unhealthy fats, increasing healthy fats, and making other small dietary changes. This includes maintaining a healthy body weight to help keep lipid levels within a normal range.  WHAT IS MY PLAN?  Your health care provider recommends that you:  Drink a glass of water before meals to help with satiety.  Eat slowly.  An alternative to the water is to add Metamucil. This will help with satiety as well. It does contain calories, unlike water.  WHAT TYPES OF FAT SHOULD I CHOOSE?  Choose healthy fats more often. Choose monounsaturated and polyunsaturated fats, such as olive oil and canola oil, flaxseeds, walnuts, almonds, and seeds.  Eat more omega-3 fats. Good choices include salmon, mackerel, sardines, tuna, flaxseed oil, and ground flaxseeds. Aim to eat fish at least two times each week.  Avoid foods with partially hydrogenated oils in them. These contain trans fats. Examples of foods that contain trans fats are stick margarine, some tub margarines, cookies, crackers, and other baked goods. If you are going to avoid a fat, this is the one to avoid!  WHAT GENERAL GUIDELINES DO I NEED TO FOLLOW?  Check food labels carefully to identify foods with trans fats. Avoid these types of options when possible.  Fill one half of your plate with vegetables and green salads. Eat 4-5 servings of vegetables per day. A serving of vegetables equals 1 cup of raw leafy vegetables,  cup of raw or cooked cut-up  vegetables, or  cup of vegetable juice.  Fill one fourth of your plate with whole grains. Look for the word "whole" as the first word in the ingredient list.  Fill one fourth of your plate with lean protein foods.  Eat 4-5 servings of fruit per day. A serving of fruit equals one medium whole fruit,  cup of dried fruit,  cup of fresh, frozen, or canned fruit. Try to avoid fruits in cups/syrups as the sugar content can be high.  Eat more foods that contain soluble fiber. Examples of foods that contain this type of fiber are apples, broccoli, carrots, beans, peas, and barley. Aim to get 20-30 g of fiber per day.  Eat more home-cooked food and less restaurant, buffet, and fast food.  Limit or avoid alcohol.  Limit foods that are high in starch and sugar.  Avoid fried foods when able.  Cook foods by using methods other than frying. Baking, boiling, grilling, and broiling are all great options. Other fat-reducing suggestions include: ? Removing the skin from poultry. ? Removing all visible fats from meats. ? Skimming the fat off of stews, soups, and gravies before serving them. ? Steaming vegetables in water or broth.  Lose weight if you are overweight. Losing just 5-10% of your initial body weight can help your overall health and prevent diseases such as diabetes and heart disease.  Increase your consumption of nuts, legumes, and seeds to 4-5 servings per week. One serving of dried beans or legumes equals  cup after being cooked, one serving of nuts equals  1 ounces, and one serving of seeds equals  ounce or 1 tablespoon.  WHAT ARE GOOD FOODS CAN I EAT? Grains Grainy breads (try to find bread that is 3 g of fiber per slice or greater), oatmeal, light popcorn. Whole-grain cereals. Rice and pasta, including brown rice and those that are made with whole wheat. Edamame pasta is a great alternative to grain pasta. It has a higher protein content. Try to avoid significant consumption of white  bread, sugary cereals, or pastries/baked goods.  Vegetables All vegetables. Cooked white potatoes do not count as vegetables.  Fruits All fruits, but limit pineapple and bananas as these fruits have a higher sugar content.  Meats and Other Protein Sources Lean, well-trimmed beef, veal, pork, and lamb. Chicken and [place] without skin. All fish and shellfish. Wild duck, rabbit, pheasant, and venison. Egg whites or low-cholesterol egg substitutes. Dried beans, peas, lentils, and tofu.Seeds and most nuts.  Dairy Low-fat or nonfat cheeses, including ricotta, string, and mozzarella. Skim or 1% milk that is liquid, powdered, or evaporated. Buttermilk that is made with low-fat milk. Nonfat or low-fat yogurt. Soy/Almond milk are good alternatives if you cannot handle dairy.  Beverages Water is the best for you. Sports drinks with less sugar are more desirable unless you are a highly active athlete.  Sweets and Desserts Sherbets and fruit ices. Honey, jam, marmalade, jelly, and syrups. Dark chocolate.  Eat all sweets and desserts in moderation.  Fats and Oils Nonhydrogenated (trans-free) margarines. Vegetable oils, including soybean, sesame, sunflower, olive, peanut, safflower, corn, canola, and cottonseed. Salad dressings or mayonnaise that are made with a vegetable oil. Limit added fats and oils that you use for cooking, baking, salads, and as spreads.  Other Cocoa powder. Coffee and tea. Most condiments.  The items listed above may not be a complete list of recommended foods or beverages. Contact your dietitian for more options.

## 2019-01-17 ENCOUNTER — Telehealth: Payer: Self-pay | Admitting: Family Medicine

## 2019-01-17 LAB — MICROALBUMIN / CREATININE URINE RATIO
Creatinine,U: 340 mg/dL
Microalb Creat Ratio: 0.7 mg/g (ref 0.0–30.0)
Microalb, Ur: 2.2 mg/dL — ABNORMAL HIGH (ref 0.0–1.9)

## 2019-01-17 NOTE — Telephone Encounter (Signed)
Copied from [place] [phone]. Topic: General - Other >> Jan 16, 2019  4:28 PM Percell Belt A wrote: Reason for CRM:  Balinka with [facility] care called in and stated that if pt is needed to check blood sugar they are needing an order to check his sugar and often?   Please fax to [phone]  Letter done and faxed

## 2019-01-17 NOTE — Telephone Encounter (Signed)
Check as needed for now. PRN lightheaded, confused, weak. Not scheduled just yet. Ty.

## 2019-01-18 ENCOUNTER — Encounter: Payer: Self-pay | Admitting: Family Medicine

## 2019-01-31 DIAGNOSIS — Z20828 Contact with and (suspected) exposure to other viral communicable diseases: Secondary | ICD-10-CM | POA: Diagnosis not present

## 2019-02-14 ENCOUNTER — Other Ambulatory Visit: Payer: Self-pay | Admitting: Family Medicine

## 2019-02-15 DIAGNOSIS — H35351 Cystoid macular degeneration, right eye: Secondary | ICD-10-CM | POA: Diagnosis not present

## 2019-02-15 NOTE — Telephone Encounter (Signed)
This request if for 7 days only?  #7 is this correct?

## 2019-04-03 ENCOUNTER — Other Ambulatory Visit: Payer: Self-pay | Admitting: Family Medicine

## 2019-04-03 DIAGNOSIS — J301 Allergic rhinitis due to pollen: Secondary | ICD-10-CM

## 2019-04-19 ENCOUNTER — Ambulatory Visit (INDEPENDENT_AMBULATORY_CARE_PROVIDER_SITE_OTHER): Payer: Medicare HMO | Admitting: Family Medicine

## 2019-04-19 ENCOUNTER — Encounter: Payer: Self-pay | Admitting: Family Medicine

## 2019-04-19 ENCOUNTER — Other Ambulatory Visit: Payer: Self-pay

## 2019-04-19 VITALS — BP 114/80 | HR 119 | Temp 96.9°F | Ht 74.0 in | Wt 259.4 lb

## 2019-04-19 DIAGNOSIS — R000 Tachycardia, unspecified: Secondary | ICD-10-CM | POA: Diagnosis not present

## 2019-04-19 DIAGNOSIS — E119 Type 2 diabetes mellitus without complications: Secondary | ICD-10-CM | POA: Diagnosis not present

## 2019-04-19 NOTE — Progress Notes (Signed)
Subjective:   Chief Complaint  Patient presents with  . Follow-up    Ross Haynes is a 53 y.o. male here for follow-up of diabetes.   [name]  Does not check his sugars Patient does not require insulin.   Medications include: diet controlled Exercise: none Diet is poor. Receives meals prepared at group home he lives at.   Past Medical History:  Diagnosis Date  . Depression   . Dyslexia   . History of chicken pox      Related testing: Date of retinal exam: Done Pneumovax: done Flu Shot: going to get in the fall  Review of Systems: Pulmonary:  No SOB Cardiovascular:  No chest pain  Objective:  BP 114/80 (BP Location: Left Arm, Patient Position: Sitting, Cuff Size: Large)   Pulse (!) 119   Temp (!) 96.9 F (36.1 C) (Temporal)   Ht 6\' 2"  (1.88 m)   Wt 259 lb 6 oz (117.7 kg)   SpO2 95%   BMI 33.30 kg/m  General:  Well developed, well nourished, in no apparent distress Skin:  Warm, no pallor or diaphoresis Head:  Normocephalic, atraumatic Eyes:  Pupils equal and round, sclera anicteric without injection  Lungs:  CTAB, no access msc use Cardio:  Reg rhythm, tachycardic, no bruits, no LE edema Psych: Age appropriate judgment and insight  Assessment:   Diabetes mellitus without complication (HCC) - Plan: Hemoglobin A1c, 1800 kcal diet, 125 g carbs daily, 30 min of exercise daily ordered for living home.   Tachycardia - Plan: CBC, TSH,   Plan:   Orders as above. Counseled on diet and exercise. F/u in 3 mo. The patient voiced understanding and agreement to the plan.  [place], DO 04/19/19 3:35 PM

## 2019-04-19 NOTE — Patient Instructions (Addendum)
Give us 2-3 business days to get the results of your labs back.   I recommend getting the flu shot in mid October. This suggestion would change if the CDC comes out with a different recommendation.   Keep the diet clean and stay active.  Let us know if you need anything.  

## 2019-04-20 LAB — CBC
HCT: 49.8 % (ref 38.5–50.0)
Hemoglobin: 16.7 g/dL (ref 13.2–17.1)
MCH: 29.7 pg (ref 27.0–33.0)
MCHC: 33.5 g/dL (ref 32.0–36.0)
MCV: 88.5 fL (ref 80.0–100.0)
MPV: 9.4 fL (ref 7.5–12.5)
Platelets: 281 10*3/uL (ref 140–400)
RBC: 5.63 10*6/uL (ref 4.20–5.80)
RDW: 13.1 % (ref 11.0–15.0)
WBC: 5.1 10*3/uL (ref 3.8–10.8)

## 2019-04-20 LAB — TSH: TSH: 1.98 mIU/L (ref 0.40–4.50)

## 2019-04-20 LAB — HEMOGLOBIN A1C
Hgb A1c MFr Bld: 6.3 % of total Hgb — ABNORMAL HIGH (ref <5.7)
Mean Plasma Glucose: 134 (calc)
eAG (mmol/L): 7.4 (calc)

## 2019-04-30 DIAGNOSIS — R000 Tachycardia, unspecified: Secondary | ICD-10-CM | POA: Diagnosis not present

## 2019-04-30 DIAGNOSIS — R69 Illness, unspecified: Secondary | ICD-10-CM | POA: Diagnosis not present

## 2019-04-30 DIAGNOSIS — Z79899 Other long term (current) drug therapy: Secondary | ICD-10-CM | POA: Diagnosis not present

## 2019-04-30 DIAGNOSIS — R251 Tremor, unspecified: Secondary | ICD-10-CM | POA: Diagnosis not present

## 2019-04-30 DIAGNOSIS — F329 Major depressive disorder, single episode, unspecified: Secondary | ICD-10-CM | POA: Diagnosis not present

## 2019-04-30 DIAGNOSIS — Z7951 Long term (current) use of inhaled steroids: Secondary | ICD-10-CM | POA: Diagnosis not present

## 2019-04-30 DIAGNOSIS — E7800 Pure hypercholesterolemia, unspecified: Secondary | ICD-10-CM | POA: Diagnosis not present

## 2019-05-02 DIAGNOSIS — R69 Illness, unspecified: Secondary | ICD-10-CM | POA: Diagnosis not present

## 2019-05-15 ENCOUNTER — Other Ambulatory Visit: Payer: Self-pay | Admitting: Family Medicine

## 2019-05-15 DIAGNOSIS — J301 Allergic rhinitis due to pollen: Secondary | ICD-10-CM

## 2019-05-15 MED ORDER — LEVOCETIRIZINE DIHYDROCHLORIDE 5 MG PO TABS: ORAL_TABLET | ORAL | 0 refills | Status: DC

## 2019-05-15 MED ORDER — ATORVASTATIN CALCIUM 40 MG PO TABS: 40.0000 mg | ORAL_TABLET | Freq: Every day | ORAL | 0 refills | Status: DC

## 2019-06-24 ENCOUNTER — Telehealth: Payer: Self-pay | Admitting: Family Medicine

## 2019-06-24 NOTE — Telephone Encounter (Signed)
Patient father DILON SABETTA called to inquire when the patient's last ov was. He is completing some social security forms. He was given the date.

## 2019-06-24 NOTE — Telephone Encounter (Signed)
Called the father back informed last OV was 04/19/2019.

## 2019-07-12 ENCOUNTER — Ambulatory Visit: Payer: Medicare HMO | Admitting: Family Medicine

## 2019-07-15 DIAGNOSIS — R69 Illness, unspecified: Secondary | ICD-10-CM | POA: Diagnosis not present

## 2019-07-17 ENCOUNTER — Telehealth: Payer: Self-pay | Admitting: Family Medicine

## 2019-07-17 NOTE — Telephone Encounter (Signed)
Copied from [place] [id number]. Topic: General - Call Back - No Documentation >> Jul 17, 2019 12:45 PM Yvette Rack wrote: Reason for CRM: Pt stated he had a missed call from the office. Pt requests call back  Do not see where anyone has called this patient

## 2019-07-22 ENCOUNTER — Other Ambulatory Visit: Payer: Self-pay

## 2019-07-22 ENCOUNTER — Encounter: Payer: Self-pay | Admitting: Family Medicine

## 2019-07-22 ENCOUNTER — Ambulatory Visit (INDEPENDENT_AMBULATORY_CARE_PROVIDER_SITE_OTHER): Payer: Medicare HMO | Admitting: Family Medicine

## 2019-07-22 DIAGNOSIS — E1165 Type 2 diabetes mellitus with hyperglycemia: Secondary | ICD-10-CM | POA: Diagnosis not present

## 2019-07-22 DIAGNOSIS — F339 Major depressive disorder, recurrent, unspecified: Secondary | ICD-10-CM | POA: Diagnosis not present

## 2019-07-22 DIAGNOSIS — R69 Illness, unspecified: Secondary | ICD-10-CM | POA: Diagnosis not present

## 2019-07-22 NOTE — Progress Notes (Signed)
Subjective:   Chief Complaint  Patient presents with  . Follow-up    6 month    Ross Haynes is a 53 y.o. male here for follow-up of diabetes.  Due to COVID-19 pandemic, we are interacting via telephoe. I verified patient's ID using 2 identifiers. Patient agreed to proceed with visit via this method. Patient is at home, I am at office. Patient and I are present for visit.  Ross Haynes does not check his sugars. Patient does not require insulin.   Medications include: diet controlled Diet could be better Last a1c was 6.3 Exercise: walking   Hx of anxiety/depression. Doing well, compliant w Prozac. No AE's. No SI or HI. Not following with counselor or psychologist. Would like to.   Past Medical History:  Diagnosis Date  . Depression   . Dyslexia   . History of chicken pox      Related testing: Date of retinal exam: Done Pneumovax: done Flu Shot: will get  Review of Systems: Pulmonary:  No SOB Cardiovascular:  No chest pain  Objective:  No conversational dyspnea Age appropriate judgment and insight Nml affect and mood  Assessment:   Type 2 diabetes mellitus with hyperglycemia, without long-term current use of insulin (HCC)  Depression, recurrent (HCC)   Plan:   Diet controlled. Doing well. Counseled on diet and exercise. Cont Prozac, set up with counseling.  F/u in 6 mo for CPE or prn. Total time: 11 min The patient voiced understanding and agreement to the plan.  [place], DO 07/22/19 2:52 PM

## 2019-07-24 ENCOUNTER — Ambulatory Visit: Payer: Medicare HMO

## 2019-08-13 DIAGNOSIS — H35351 Cystoid macular degeneration, right eye: Secondary | ICD-10-CM | POA: Diagnosis not present

## 2019-09-09 ENCOUNTER — Other Ambulatory Visit: Payer: Self-pay | Admitting: Family Medicine

## 2019-09-09 DIAGNOSIS — J301 Allergic rhinitis due to pollen: Secondary | ICD-10-CM

## 2019-09-09 MED ORDER — ATORVASTATIN CALCIUM 40 MG PO TABS: 40.0000 mg | ORAL_TABLET | Freq: Every day | ORAL | 0 refills | Status: DC

## 2019-09-09 MED ORDER — LEVOCETIRIZINE DIHYDROCHLORIDE 5 MG PO TABS: ORAL_TABLET | ORAL | 0 refills | Status: DC

## 2019-09-30 ENCOUNTER — Other Ambulatory Visit: Payer: Self-pay | Admitting: Family Medicine

## 2019-09-30 DIAGNOSIS — R69 Illness, unspecified: Secondary | ICD-10-CM | POA: Diagnosis not present

## 2019-09-30 MED ORDER — ONETOUCH DELICA LANCETS 30G MISC: 1.0000 | Freq: Every day | 2 refills | Status: DC

## 2019-10-04 DIAGNOSIS — H40059 Ocular hypertension, unspecified eye: Secondary | ICD-10-CM | POA: Diagnosis not present

## 2019-10-04 DIAGNOSIS — E669 Obesity, unspecified: Secondary | ICD-10-CM | POA: Diagnosis not present

## 2019-10-04 DIAGNOSIS — Z79899 Other long term (current) drug therapy: Secondary | ICD-10-CM | POA: Diagnosis not present

## 2019-10-04 DIAGNOSIS — E785 Hyperlipidemia, unspecified: Secondary | ICD-10-CM | POA: Diagnosis not present

## 2019-10-04 DIAGNOSIS — Z8249 Family history of ischemic heart disease and other diseases of the circulatory system: Secondary | ICD-10-CM | POA: Diagnosis not present

## 2019-10-04 DIAGNOSIS — J309 Allergic rhinitis, unspecified: Secondary | ICD-10-CM | POA: Diagnosis not present

## 2019-10-04 DIAGNOSIS — R69 Illness, unspecified: Secondary | ICD-10-CM | POA: Diagnosis not present

## 2019-11-01 ENCOUNTER — Other Ambulatory Visit: Payer: Self-pay | Admitting: Family Medicine

## 2019-11-01 MED ORDER — FLUTICASONE PROPIONATE 50 MCG/ACT NA SUSP: NASAL | 3 refills | Status: DC

## 2019-11-27 ENCOUNTER — Other Ambulatory Visit: Payer: Self-pay | Admitting: Family Medicine

## 2019-11-27 DIAGNOSIS — J301 Allergic rhinitis due to pollen: Secondary | ICD-10-CM

## 2019-11-27 MED ORDER — LEVOCETIRIZINE DIHYDROCHLORIDE 5 MG PO TABS: ORAL_TABLET | ORAL | 2 refills | Status: DC

## 2019-11-27 MED ORDER — ATORVASTATIN CALCIUM 40 MG PO TABS: 40.0000 mg | ORAL_TABLET | Freq: Every day | ORAL | 2 refills | Status: DC

## 2019-12-11 DIAGNOSIS — H59811 Chorioretinal scars after surgery for detachment, right eye: Secondary | ICD-10-CM | POA: Diagnosis not present

## 2019-12-11 DIAGNOSIS — H35351 Cystoid macular degeneration, right eye: Secondary | ICD-10-CM | POA: Diagnosis not present

## 2019-12-11 DIAGNOSIS — H35371 Puckering of macula, right eye: Secondary | ICD-10-CM | POA: Diagnosis not present

## 2019-12-30 ENCOUNTER — Other Ambulatory Visit: Payer: Self-pay | Admitting: Family Medicine

## 2019-12-30 MED ORDER — ONETOUCH VERIO VI STRP: ORAL_STRIP | 6 refills | Status: DC

## 2019-12-31 DIAGNOSIS — R69 Illness, unspecified: Secondary | ICD-10-CM | POA: Diagnosis not present

## 2020-01-10 ENCOUNTER — Encounter: Payer: Medicare HMO | Admitting: Family Medicine

## 2020-01-21 ENCOUNTER — Encounter: Payer: Medicare HMO | Admitting: Family Medicine

## 2020-02-11 ENCOUNTER — Other Ambulatory Visit: Payer: Self-pay

## 2020-02-11 DIAGNOSIS — R69 Illness, unspecified: Secondary | ICD-10-CM | POA: Diagnosis not present

## 2020-02-11 MED ORDER — FLUOXETINE HCL 20 MG PO CAPS: ORAL_CAPSULE | ORAL | 2 refills | Status: DC

## 2020-02-11 NOTE — Telephone Encounter (Signed)
Advise on instructions for medication refill

## 2020-02-11 NOTE — Telephone Encounter (Signed)
Patient called in to get a prescription refill for FLUoxetine (PROZAC) 20 MG capsule [[id number]]    Please send it to [facility] [address]  [place], [place] [address]  Phone:  [phone] Fax:  [phone]  DEA #:  --

## 2020-02-12 ENCOUNTER — Telehealth: Payer: Self-pay | Admitting: Family Medicine

## 2020-02-12 ENCOUNTER — Other Ambulatory Visit: Payer: Self-pay

## 2020-02-12 ENCOUNTER — Encounter: Payer: Self-pay | Admitting: Family Medicine

## 2020-02-12 ENCOUNTER — Ambulatory Visit (INDEPENDENT_AMBULATORY_CARE_PROVIDER_SITE_OTHER): Payer: Medicare HMO | Admitting: Family Medicine

## 2020-02-12 VITALS — BP 120/72 | HR 106 | Temp 96.4°F | Ht 74.0 in | Wt 261.1 lb

## 2020-02-12 DIAGNOSIS — E1165 Type 2 diabetes mellitus with hyperglycemia: Secondary | ICD-10-CM

## 2020-02-12 DIAGNOSIS — Z1159 Encounter for screening for other viral diseases: Secondary | ICD-10-CM

## 2020-02-12 DIAGNOSIS — Z0000 Encounter for general adult medical examination without abnormal findings: Secondary | ICD-10-CM

## 2020-02-12 LAB — LIPID PANEL
Cholesterol: 197 mg/dL (ref 0–200)
HDL: 39.9 mg/dL (ref >39.00)
NonHDL: 156.87
Total CHOL/HDL Ratio: 5
Triglycerides: 248 mg/dL — ABNORMAL HIGH (ref 0.0–149.0)
VLDL: 49.6 mg/dL — ABNORMAL HIGH (ref 0.0–40.0)

## 2020-02-12 LAB — CBC
HCT: 46.3 % (ref 39.0–52.0)
Hemoglobin: 15.8 g/dL (ref 13.0–17.0)
MCHC: 34.1 g/dL (ref 30.0–36.0)
MCV: 86.2 fl (ref 78.0–100.0)
Platelets: 258 10*3/uL (ref 150.0–400.0)
RBC: 5.37 Mil/uL (ref 4.22–5.81)
RDW: 14 % (ref 11.5–15.5)
WBC: 5.2 10*3/uL (ref 4.0–10.5)

## 2020-02-12 LAB — COMPREHENSIVE METABOLIC PANEL
ALT: 43 U/L (ref 0–53)
AST: 25 U/L (ref 0–37)
Albumin: 4.5 g/dL (ref 3.5–5.2)
Alkaline Phosphatase: 90 U/L (ref 39–117)
BUN: 19 mg/dL (ref 6–23)
CO2: 30 mEq/L (ref 19–32)
Calcium: 9.6 mg/dL (ref 8.4–10.5)
Chloride: 104 mEq/L (ref 96–112)
Creatinine, Ser: 1.04 mg/dL (ref 0.40–1.50)
GFR: 74.4 mL/min (ref >60.00)
Glucose, Bld: 116 mg/dL — ABNORMAL HIGH (ref 70–99)
Potassium: 4.2 mEq/L (ref 3.5–5.1)
Sodium: 141 mEq/L (ref 135–145)
Total Bilirubin: 0.9 mg/dL (ref 0.2–1.2)
Total Protein: 7.2 g/dL (ref 6.0–8.3)

## 2020-02-12 LAB — LDL CHOLESTEROL, DIRECT: Direct LDL: 125 mg/dL

## 2020-02-12 LAB — MICROALBUMIN / CREATININE URINE RATIO
Creatinine,U: 184.7 mg/dL
Microalb Creat Ratio: 0.4 mg/g (ref 0.0–30.0)
Microalb, Ur: <0.7 mg/dL (ref 0.0–1.9)

## 2020-02-12 LAB — HEMOGLOBIN A1C: Hgb A1c MFr Bld: 6.7 % — ABNORMAL HIGH (ref 4.6–6.5)

## 2020-02-12 NOTE — Patient Instructions (Addendum)
Give [place] 2-3 business days to get the results of your labs back.   Keep the diet clean and stay active.  I do recommend you get the [place] or [employer] covid vaccination.   Try to get around 60 oz of water daily outside of exercise.   Let us know if you need anything.

## 2020-02-12 NOTE — Progress Notes (Signed)
Chief Complaint  Patient presents with   Annual Exam    Well Male Ross Haynes is here for a complete physical.   His last physical was >1 year ago.  Current diet: in general, a "healthy" diet.  Current exercise: cycling Weight trend: stable Daytime fatigue? No. Seat belt? Yes.    Health maintenance Shingrix- Yes Colonoscopy- Yes Tetanus- Yes HIV- Yes Hep C- No   Past Medical History:  Diagnosis Date   Depression    Dyslexia    History of chicken pox       Past Surgical History:  Procedure Laterality Date   NO PAST SURGERIES     Medications  Current Outpatient Medications on File Prior to Visit  Medication Sig Dispense Refill   atorvastatin (LIPITOR) 40 MG tablet Take 1 tablet (40 mg total) by mouth daily. 90 tablet 2   Blood Glucose Monitoring Suppl (ONETOUCH VERIO FLEX SYSTEM) w/Device KIT Use daily to check blood sugar 1 kit 0   FLUoxetine (PROZAC) 20 MG capsule Take 1 capsule daily. 90 capsule 2   fluticasone (FLONASE) 50 MCG/ACT nasal spray SHAKE LIQUID AND USE 2 SPRAYS IN EACH NOSTRIL DAILY 48 g 3   glucose blood (ONETOUCH VERIO) test strip Use daily to check blood sugar 100 each 6   levocetirizine (XYZAL) 5 MG tablet TAKE 1 TABLET BY MOUTH EVERY EVENING 90 tablet 2   OneTouch Delica Lancets 19E MISC Inject 1 Device as directed daily. 100 each 2   Allergies No Known Allergies  Family History Family History  Problem Relation Age of Onset   COPD Mother    Hypertension Mother    Diabetes Mother    Cancer Father        Prostate   Diabetes Father    Hypertension Father    Cancer Brother        Kidney   Hypertension Brother    Hyperlipidemia Brother    Diabetes Brother    Hyperlipidemia Maternal Uncle    Hypertension Maternal Uncle    Diabetes Maternal Uncle    Heart disease Maternal Uncle    Diabetes Paternal Grandmother    Heart disease Paternal Grandmother    Hypertension Paternal Grandmother    Glaucoma  Paternal Grandmother     Review of Systems: Constitutional:  no fevers Eye:  no recent significant change in vision Ear/Nose/Mouth/Throat:  Ears:  no hearing loss Nose/Mouth/Throat:  no complaints of nasal congestion, no sore throat Cardiovascular:  no chest pain, no palpitations Respiratory:  no cough and no shortness of breath Gastrointestinal:  no abdominal pain, no change in bowel habits GU:  Male: negative for dysuria, frequency, and incontinence and negative for prostate symptoms Musculoskeletal/Extremities:  no pain, redness, or swelling of the joints Integumentary (Skin/Breast):  no abnormal skin lesions reported Neurologic:  no headaches Endocrine: No unexpected weight changes Hematologic/Lymphatic:  no abnormal bleeding  Exam BP 120/72 (BP Location: Left Arm, Patient Position: Sitting, Cuff Size: Large)    Pulse (!) 106    Temp (!) 96.4 F (35.8 C) (Temporal)    Ht 6' 2"  (1.88 m)    Wt 261 lb 2 oz (118.4 kg)    SpO2 98%    BMI 33.53 kg/m  General:  well developed, well nourished, in no apparent distress Skin:  no significant moles, warts, or growths Head:  no masses, lesions, or tenderness Eyes:  pupils equal and round, sclera anicteric without injection Ears:  canals without lesions, TMs shiny without retraction, no obvious effusion,  no erythema Nose:  nares patent, septum midline, mucosa normal Throat/Pharynx:  lips and gingiva without lesion; tongue and uvula midline; non-inflamed pharynx; no exudates or postnasal drainage Neck: neck supple without adenopathy, thyromegaly, or masses Cardiac: RRR, no bruits, no LE edema Lungs:  clear to auscultation, breath sounds equal bilaterally, no respiratory distress Rectal: Deferred Musculoskeletal:  symmetrical muscle groups noted without atrophy or deformity Neuro:  gait normal; deep tendon reflexes normal and symmetric Psych: well oriented with normal range of affect and appropriate judgment/insight  Assessment and  Plan  Well adult exam - Plan: CBC, Comprehensive metabolic panel, Lipid panel  Type 2 diabetes mellitus with hyperglycemia, without long-term current use of insulin (HCC) - Plan: Hemoglobin A1c, Microalbumin / creatinine urine ratio  Encounter for hepatitis C screening test for low risk patient - Plan: Hepatitis C antibody   Well 54 y.o. male. Counseled on diet and exercise. Counseled on risks and benefits of prostate cancer screening with PSA. The patient agrees to forego testing. Immunizations, labs, and further orders as above. Follow up in 3-6 mo pending labs. The patient voiced understanding and agreement to the plan.  [place], DO 02/12/20 9:41 AM

## 2020-02-12 NOTE — Telephone Encounter (Signed)
Caller Ansel Ferrall  Call Back # [phone]  Patient is calling in regards to lab results

## 2020-02-12 NOTE — Telephone Encounter (Signed)
Called informed of PCP response. 

## 2020-02-12 NOTE — Telephone Encounter (Signed)
He has a few more I am waiting for on him and will send him a message once I get them back. Pt's are told 2-3 business days to get the results back, not 2-3 hours. Ty.

## 2020-02-13 LAB — HEPATITIS C ANTIBODY
Hepatitis C Ab: NONREACTIVE
SIGNAL TO CUT-OFF: 0.48 (ref <1.00)

## 2020-02-14 ENCOUNTER — Other Ambulatory Visit: Payer: Self-pay | Admitting: Family Medicine

## 2020-02-14 DIAGNOSIS — E7800 Pure hypercholesterolemia, unspecified: Secondary | ICD-10-CM

## 2020-02-14 MED ORDER — ATORVASTATIN CALCIUM 80 MG PO TABS
80.0000 mg | ORAL_TABLET | Freq: Every day | ORAL | 3 refills | Status: DC
Start: 2020-02-14 — End: 2021-01-20

## 2020-04-03 ENCOUNTER — Other Ambulatory Visit (INDEPENDENT_AMBULATORY_CARE_PROVIDER_SITE_OTHER): Payer: Medicare HMO

## 2020-04-03 ENCOUNTER — Other Ambulatory Visit: Payer: Self-pay

## 2020-04-03 ENCOUNTER — Other Ambulatory Visit: Payer: Self-pay | Admitting: Family Medicine

## 2020-04-03 DIAGNOSIS — E7800 Pure hypercholesterolemia, unspecified: Secondary | ICD-10-CM

## 2020-04-03 DIAGNOSIS — R69 Illness, unspecified: Secondary | ICD-10-CM | POA: Diagnosis not present

## 2020-04-03 LAB — LIPID PANEL
Cholesterol: 201 mg/dL — ABNORMAL HIGH (ref 0–200)
HDL: 39.3 mg/dL (ref >39.00)
NonHDL: 161.75
Total CHOL/HDL Ratio: 5
Triglycerides: 265 mg/dL — ABNORMAL HIGH (ref 0.0–149.0)
VLDL: 53 mg/dL — ABNORMAL HIGH (ref 0.0–40.0)

## 2020-04-03 LAB — LDL CHOLESTEROL, DIRECT: Direct LDL: 128 mg/dL

## 2020-04-03 LAB — HEPATIC FUNCTION PANEL
ALT: 41 U/L (ref 0–53)
AST: 26 U/L (ref 0–37)
Albumin: 4.3 g/dL (ref 3.5–5.2)
Alkaline Phosphatase: 72 U/L (ref 39–117)
Bilirubin, Direct: 0.1 mg/dL (ref 0.0–0.3)
Total Bilirubin: 0.9 mg/dL (ref 0.2–1.2)
Total Protein: 7.3 g/dL (ref 6.0–8.3)

## 2020-04-03 MED ORDER — EZETIMIBE 10 MG PO TABS: 10.0000 mg | ORAL_TABLET | Freq: Every day | ORAL | 3 refills | Status: DC

## 2020-04-03 NOTE — Progress Notes (Signed)
lipi

## 2020-04-30 DIAGNOSIS — R69 Illness, unspecified: Secondary | ICD-10-CM | POA: Diagnosis not present

## 2020-05-22 ENCOUNTER — Other Ambulatory Visit: Payer: Medicare HMO

## 2020-05-25 ENCOUNTER — Other Ambulatory Visit: Payer: Medicare HMO

## 2020-06-12 DIAGNOSIS — H35351 Cystoid macular degeneration, right eye: Secondary | ICD-10-CM | POA: Diagnosis not present

## 2020-06-12 DIAGNOSIS — H59811 Chorioretinal scars after surgery for detachment, right eye: Secondary | ICD-10-CM | POA: Diagnosis not present

## 2020-06-12 DIAGNOSIS — H35371 Puckering of macula, right eye: Secondary | ICD-10-CM | POA: Diagnosis not present

## 2020-06-15 ENCOUNTER — Other Ambulatory Visit: Payer: Self-pay

## 2020-06-15 ENCOUNTER — Other Ambulatory Visit (INDEPENDENT_AMBULATORY_CARE_PROVIDER_SITE_OTHER): Payer: Medicare HMO

## 2020-06-15 ENCOUNTER — Ambulatory Visit (INDEPENDENT_AMBULATORY_CARE_PROVIDER_SITE_OTHER): Payer: Medicare HMO

## 2020-06-15 DIAGNOSIS — Z23 Encounter for immunization: Secondary | ICD-10-CM

## 2020-06-15 DIAGNOSIS — E7800 Pure hypercholesterolemia, unspecified: Secondary | ICD-10-CM

## 2020-06-15 NOTE — Addendum Note (Signed)
Addended by: Kem Boroughs D on: 06/15/2020 10:46 AM   Modules accepted: Orders

## 2020-06-15 NOTE — Addendum Note (Signed)
Addended by: Kem Boroughs D on: 06/15/2020 10:44 AM   Modules accepted: Orders

## 2020-06-16 LAB — LIPID PANEL
Cholesterol: 130 mg/dL (ref <200)
HDL: 44 mg/dL (ref >=40)
LDL Cholesterol (Calc): 68 mg/dL (calc)
Non-HDL Cholesterol (Calc): 86 mg/dL (calc) (ref <130)
Total CHOL/HDL Ratio: 3 (calc) (ref <5.0)
Triglycerides: 97 mg/dL (ref <150)

## 2020-06-26 DIAGNOSIS — R69 Illness, unspecified: Secondary | ICD-10-CM | POA: Diagnosis not present

## 2020-07-27 ENCOUNTER — Other Ambulatory Visit: Payer: Self-pay | Admitting: Family Medicine

## 2020-07-27 MED ORDER — EZETIMIBE 10 MG PO TABS: 10.0000 mg | ORAL_TABLET | Freq: Every day | ORAL | 6 refills | Status: DC

## 2020-08-17 ENCOUNTER — Ambulatory Visit: Payer: Medicare HMO | Admitting: Family Medicine

## 2020-08-19 ENCOUNTER — Telehealth: Payer: Self-pay | Admitting: Family Medicine

## 2020-08-19 DIAGNOSIS — J301 Allergic rhinitis due to pollen: Secondary | ICD-10-CM

## 2020-08-19 MED ORDER — LEVOCETIRIZINE DIHYDROCHLORIDE 5 MG PO TABS: ORAL_TABLET | ORAL | 2 refills | Status: DC

## 2020-08-19 NOTE — Telephone Encounter (Signed)
Refill done.  

## 2020-08-19 NOTE — Telephone Encounter (Signed)
Medication:  levocetirizine (XYZAL) 5 MG tablet [[id number]]      Has the patient contacted their pharmacy?  (If no, request that the patient contact the pharmacy for the refill.) (If yes, when and what did the pharmacy advise?)     Preferred Pharmacy (with phone number or street name): [place], [place] - [address]  [place], [place] [address]  Phone:  [phone] Fax:  [phone]     Agent: Please be advised that RX refills may take up to 3 business days. We ask that you follow-up with your pharmacy.

## 2020-10-01 DIAGNOSIS — Z7722 Contact with and (suspected) exposure to environmental tobacco smoke (acute) (chronic): Secondary | ICD-10-CM | POA: Diagnosis not present

## 2020-10-01 DIAGNOSIS — R030 Elevated blood-pressure reading, without diagnosis of hypertension: Secondary | ICD-10-CM | POA: Diagnosis not present

## 2020-10-01 DIAGNOSIS — Z79899 Other long term (current) drug therapy: Secondary | ICD-10-CM | POA: Diagnosis not present

## 2020-10-01 DIAGNOSIS — R69 Illness, unspecified: Secondary | ICD-10-CM | POA: Diagnosis not present

## 2020-10-01 DIAGNOSIS — J309 Allergic rhinitis, unspecified: Secondary | ICD-10-CM | POA: Diagnosis not present

## 2020-10-01 DIAGNOSIS — E785 Hyperlipidemia, unspecified: Secondary | ICD-10-CM | POA: Diagnosis not present

## 2020-10-09 ENCOUNTER — Other Ambulatory Visit: Payer: Self-pay | Admitting: Family Medicine

## 2020-10-09 MED ORDER — FLUTICASONE PROPIONATE 50 MCG/ACT NA SUSP: NASAL | 3 refills | Status: DC

## 2020-10-09 MED ORDER — ONETOUCH DELICA LANCETS 30G MISC: 1.0000 | Freq: Every day | 2 refills | Status: AC

## 2020-10-09 MED ORDER — FLUOXETINE HCL 20 MG PO CAPS: ORAL_CAPSULE | ORAL | 2 refills | Status: DC

## 2020-12-18 DIAGNOSIS — R059 Cough, unspecified: Secondary | ICD-10-CM | POA: Diagnosis not present

## 2020-12-18 DIAGNOSIS — J101 Influenza due to other identified influenza virus with other respiratory manifestations: Secondary | ICD-10-CM | POA: Diagnosis not present

## 2021-01-18 DIAGNOSIS — H35351 Cystoid macular degeneration, right eye: Secondary | ICD-10-CM | POA: Diagnosis not present

## 2021-01-18 DIAGNOSIS — H59811 Chorioretinal scars after surgery for detachment, right eye: Secondary | ICD-10-CM | POA: Diagnosis not present

## 2021-01-18 DIAGNOSIS — H35371 Puckering of macula, right eye: Secondary | ICD-10-CM | POA: Diagnosis not present

## 2021-01-20 ENCOUNTER — Other Ambulatory Visit: Payer: Self-pay | Admitting: Family Medicine

## 2021-01-20 MED ORDER — ATORVASTATIN CALCIUM 80 MG PO TABS: 80.0000 mg | ORAL_TABLET | Freq: Every day | ORAL | 3 refills | Status: AC

## 2021-01-20 MED ORDER — ONETOUCH VERIO VI STRP: ORAL_STRIP | 6 refills | Status: AC

## 2021-02-16 ENCOUNTER — Other Ambulatory Visit: Payer: Self-pay | Admitting: Family Medicine

## 2021-02-16 MED ORDER — EZETIMIBE 10 MG PO TABS: 10.0000 mg | ORAL_TABLET | Freq: Every day | ORAL | 6 refills | Status: DC

## 2021-02-18 ENCOUNTER — Telehealth: Payer: Self-pay | Admitting: Family Medicine

## 2021-02-18 ENCOUNTER — Telehealth: Payer: Self-pay

## 2021-02-18 MED ORDER — EZETIMIBE 10 MG PO TABS: 10.0000 mg | ORAL_TABLET | Freq: Every day | ORAL | 6 refills | Status: AC

## 2021-02-18 NOTE — Telephone Encounter (Signed)
Patient called stating Zetia 10 mg  was sent to patients pharmacy this morning. He states he is already taking atorvastatin 80 mg do you want him to take both?   Please advise on further instruction. I did not see notes to discontinue one or the other but I wanted to be sure.

## 2021-02-18 NOTE — Telephone Encounter (Signed)
Medication: ezetimibe (ZETIA) 10 MG tablet [[id number]    Has the patient contacted their pharmacy? no (If no, request that the patient contact the pharmacy for the refill.) (If yes, when and what did the pharmacy advise?)    Preferred Pharmacy (with phone number or street name): [place], [place] - [address]  [place], [place] [address]  Phone:  [phone]  Fax:  [phone]     Agent: Please be advised that RX refills may take up to 3 business days. We ask that you follow-up with your pharmacy.

## 2021-02-18 NOTE — Telephone Encounter (Signed)
Rx sent 

## 2021-02-18 NOTE — Telephone Encounter (Signed)
Continue Lipitor, let's D/C the Zetia. He is due for a physical or DM visit, but I think he moved. If he did and does not want to continue care here, please remove my name from his chart and he will need to find a new doc. Ty.

## 2021-02-19 NOTE — Telephone Encounter (Signed)
Tried calling patient. No answer. Sent patient mychart message with instruction.

## 2021-05-17 ENCOUNTER — Other Ambulatory Visit: Payer: Self-pay | Admitting: Family Medicine

## 2021-05-17 DIAGNOSIS — J301 Allergic rhinitis due to pollen: Secondary | ICD-10-CM

## 2021-05-17 MED ORDER — LEVOCETIRIZINE DIHYDROCHLORIDE 5 MG PO TABS: ORAL_TABLET | ORAL | 2 refills | Status: AC

## 2021-07-21 ENCOUNTER — Other Ambulatory Visit: Payer: Self-pay | Admitting: Family Medicine

## 2021-07-21 MED ORDER — FLUOXETINE HCL 20 MG PO CAPS: ORAL_CAPSULE | ORAL | 2 refills | Status: AC

## 2021-10-11 ENCOUNTER — Other Ambulatory Visit: Payer: Self-pay | Admitting: Family Medicine

## 2021-10-11 MED ORDER — FLUTICASONE PROPIONATE 50 MCG/ACT NA SUSP: NASAL | 3 refills | Status: DC

## 2021-12-09 ENCOUNTER — Other Ambulatory Visit: Payer: Self-pay | Admitting: *Deleted

## 2021-12-09 DIAGNOSIS — J301 Allergic rhinitis due to pollen: Secondary | ICD-10-CM

## 2022-08-04 ENCOUNTER — Other Ambulatory Visit (HOSPITAL_COMMUNITY): Payer: Self-pay | Admitting: Internal Medicine

## 2022-08-04 ENCOUNTER — Encounter (HOSPITAL_COMMUNITY): Payer: Self-pay | Admitting: Internal Medicine

## 2022-08-04 DIAGNOSIS — E7800 Pure hypercholesterolemia, unspecified: Secondary | ICD-10-CM

## 2022-08-04 DIAGNOSIS — I10 Essential (primary) hypertension: Secondary | ICD-10-CM

## 2022-09-19 ENCOUNTER — Ambulatory Visit: Payer: Self-pay

## 2022-09-26 ENCOUNTER — Other Ambulatory Visit: Payer: Self-pay | Admitting: Family Medicine

## 2022-09-26 MED ORDER — FLUTICASONE PROPIONATE 50 MCG/ACT NA SUSP
NASAL | 3 refills | Status: AC
Start: 2022-09-26 — End: ?

## 2022-09-27 ENCOUNTER — Other Ambulatory Visit: Payer: Self-pay

## 2022-10-07 ENCOUNTER — Other Ambulatory Visit: Payer: Medicaid Other

## 2022-10-11 ENCOUNTER — Other Ambulatory Visit: Payer: Medicaid Other

## 2022-10-17 ENCOUNTER — Ambulatory Visit (INDEPENDENT_AMBULATORY_CARE_PROVIDER_SITE_OTHER): Payer: Self-pay

## 2022-10-17 DIAGNOSIS — I10 Essential (primary) hypertension: Secondary | ICD-10-CM

## 2022-10-18 ENCOUNTER — Other Ambulatory Visit: Payer: Medicare HMO
